# Patient Record
Sex: Female | Born: 1955 | Race: Black or African American | Hispanic: No | Smoking: Never smoker
Health system: Southern US, Community
[De-identification: ages and names within clinical notes are randomized; demographics above are authoritative.]

## PROBLEM LIST (undated history)

## (undated) DIAGNOSIS — L930 Discoid lupus erythematosus: Secondary | ICD-10-CM

## (undated) DIAGNOSIS — K769 Liver disease, unspecified: Secondary | ICD-10-CM

## (undated) DIAGNOSIS — K7689 Other specified diseases of liver: Secondary | ICD-10-CM

## (undated) DIAGNOSIS — D649 Anemia, unspecified: Secondary | ICD-10-CM

## (undated) DIAGNOSIS — I10 Essential (primary) hypertension: Secondary | ICD-10-CM

## (undated) DIAGNOSIS — E785 Hyperlipidemia, unspecified: Secondary | ICD-10-CM

## (undated) HISTORY — PX: COLONOSCOPY: SHX174

## (undated) HISTORY — DX: Essential (primary) hypertension: I10

## (undated) HISTORY — DX: Discoid lupus erythematosus: L93.0

## (undated) HISTORY — DX: Hyperlipidemia, unspecified: E78.5

## (undated) HISTORY — PX: LIVER BIOPSY: SHX301

## (undated) HISTORY — DX: Anemia, unspecified: D64.9

## (undated) HISTORY — DX: Liver disease, unspecified: K76.9

---

## 2007-08-25 ENCOUNTER — Ambulatory Visit: Payer: Self-pay | Admitting: Family Medicine

## 2007-08-25 ENCOUNTER — Observation Stay (HOSPITAL_COMMUNITY): Admission: EM | Admit: 2007-08-25 | Discharge: 2007-08-26 | Payer: Self-pay | Admitting: Emergency Medicine

## 2007-08-25 LAB — CONVERTED CEMR LAB
Cholesterol: 204 mg/dL
HDL: 49 mg/dL
LDL Cholesterol: 141 mg/dL
Triglycerides: 71 mg/dL

## 2007-08-26 LAB — CONVERTED CEMR LAB: TSH: 2.797 microintl units/mL

## 2007-09-02 ENCOUNTER — Ambulatory Visit: Payer: Self-pay | Admitting: Family Medicine

## 2007-09-02 DIAGNOSIS — R079 Chest pain, unspecified: Secondary | ICD-10-CM | POA: Insufficient documentation

## 2007-09-02 DIAGNOSIS — K219 Gastro-esophageal reflux disease without esophagitis: Secondary | ICD-10-CM | POA: Insufficient documentation

## 2007-09-07 ENCOUNTER — Encounter: Payer: Self-pay | Admitting: Family Medicine

## 2009-07-16 ENCOUNTER — Encounter: Admission: RE | Admit: 2009-07-16 | Discharge: 2009-07-16 | Payer: Self-pay | Admitting: Physician Assistant

## 2010-07-30 NOTE — Assessment & Plan Note (Signed)
Summary: np/hospital fu/per [name]/el   Vital Signs:  Patient Profile:   55 Years Old Female Height:     62.25 inches Weight:      155 pounds BMI:     28.22 Temp:     98.2 degrees F oral Pulse rate:   84 / minute BP sitting:   164 / 90  (left arm) Cuff size:   regular  Pt. in pain?   no  Vitals Entered By: Jacki Cones RN (September 02, 2007 3:01 PM)              Is Patient Diabetic? No     PCP:  Marisue Ivan  MD  Chief Complaint:  new patient and hospital follow-up chest discomfort.  History of Present Illness: Valerie Rojas is here for hospital f/u.  She was admitted to [facility] 08/25/07 for acute chest pain.  It was declared noncardiac on discharge given a nl EKG and neg cardiac enzymes.    Chest pain- Since her discharge, she has experienced 2 episodes of chest discomfort.  They have both occurred shortly after eating.  Pain in localized to the midsternum, nonexertional, described as achy pain that last for a few minutes associated with difficulty digesting and not requiring any nitroglycerin.  She is currently on Protonix 40mg  daily, ASA 81mg  daily, and nitro as needed.        Current Allergies: No known allergies   Past Medical History:    1.  Hx of GERD    2. s/p menopause    3. Hospitalization 08/25/07- chest pain (noncardiac, most likely GI)  Past Surgical History:    1. Cyst removed from left breast- 2005   Family History:    1. Mother- Heart dz, deceased ?MI? @ 46yo    2. Father- DM    3. Brother- DM  Social History:    Recently married 8/08.  Lives in [place] with husband.  No children.  Sells antiques.  Drinks 1-2 glasses of wine per week.  No tobacco or drug use.        [id number] (home)   Risk Factors:  Tobacco use:  never   Review of Systems       The patient complains of severe indigestion/heartburn.  The patient denies weight loss, weight gain, dyspnea on exhertion, peripheral edema, and abdominal pain.     Physical Exam  General:  Well-developed,well-nourished,in no acute distress; alert,appropriate and cooperative throughout examination Lungs:     Normal respiratory effort, chest expands symmetrically. Lungs are clear to auscultation, no crackles or wheezes. Heart:     Normal rate and regular rhythm. S1 and S2 normal without gallop, murmur, click, rub or other extra sounds. Abdomen:     Bowel sounds positive,abdomen soft and non-tender without masses, organomegaly or hernias noted. Extremities:     No clubbing, cyanosis, edema, or deformity noted with normal full range of motion of all joints.      Impression & Recommendations:  Problem # 1:  Hx of CHEST PAIN UNSPECIFIED (ICD-786.50) Assessment: Improved Most likely GI related but given family history, atypical chest pain and age, she has a high enough pretest probability that a stress test is recommended.  I have discussed this patient with Dr. Darrick Penna and he agrees with the plan for exercise stress test.  Will continue ASA and protonix.    Orders: [facility]- Est  Level 4 (09811)   Problem # 2:  GERD (ICD-530.81) Assessment: Unchanged Continue protonix.   Orders: [facility]- Est  Level  4 (78469)   Problem # 3:  ? of ESSENTIAL HYPERTENSION, BENIGN (ICD-401.1) Assessment: New Patient has an elevated BP reading in clinic today.  Will check again at next visit for dx since she has no prior hx of HTN.     Patient Instructions: 1)  Please schedule a follow-up appointment in 6 months. 2)  I will contact Dr. Darrick Penna today to get you set up with a exercise stress test before our next meeting. Once we have a date set up, we will contact you.   I want you to continue on your current medications. 3)  Your blood pressure was elevated today.  We will monitor it at your next visit.  I would like for you to decrease the amount of salt intake and increase the amount of exercise.  The recommendation is 30 minutes of cardiovascular exercise daily.  Hopefully, we can avoid  medication.    ]  Appended Document: ETT order

## 2010-09-10 ENCOUNTER — Other Ambulatory Visit: Payer: Self-pay | Admitting: Internal Medicine

## 2010-09-10 DIAGNOSIS — Z1231 Encounter for screening mammogram for malignant neoplasm of breast: Secondary | ICD-10-CM

## 2010-10-07 ENCOUNTER — Ambulatory Visit: Payer: Self-pay

## 2010-10-14 ENCOUNTER — Ambulatory Visit: Payer: Self-pay

## 2010-10-21 ENCOUNTER — Ambulatory Visit
Admission: RE | Admit: 2010-10-21 | Discharge: 2010-10-21 | Disposition: A | Discharge status: Home / Self Care | Payer: Private Health Insurance - Indemnity | Source: Ambulatory Visit | Attending: Internal Medicine | Admitting: Internal Medicine

## 2010-10-21 DIAGNOSIS — Z1231 Encounter for screening mammogram for malignant neoplasm of breast: Secondary | ICD-10-CM

## 2010-11-12 NOTE — Discharge Summary (Signed)
Valerie, Rojas NO.:  [id number]   MEDICAL RECORD NO.:  [id number]          PATIENT TYPE:  OBV   LOCATION:  3734                         FACILITY:  [facility]   PHYSICIAN:  Leighton Roach McDiarmid, M.D.DATE OF BIRTH:  12-Aug-1955   DATE OF ADMISSION:  08/25/2007  DATE OF DISCHARGE:  08/26/2007                               DISCHARGE SUMMARY   PRIMARY CARE PHYSICIAN:  Dr. Marisue Ivan at [facility].   DISCHARGE DIAGNOSES:   FINAL DIAGNOSES:  1. Noncardiac chest pain (gastroesophageal reflux disease versus      esophageal spasm)  2. Gastroesophageal reflux disease.   DISCHARGE MEDICATIONS:  1. Aspirin 81 mg p.o. daily.  2. Omeprazole 40 mg p.o. daily.  3. Nitroglycerin 0.4 mg sublingual every 5 minutes x3 doses p.r.n. for      chest pain.   CONSULTS:  None.   LABS ON ADMISSION:  Patient had a sodium of 142, potassium 3.7, bicarb  of 27.2, creatinine 0.9, glucose of 105, hemoglobin 14.6.  Point of care  enzymes were negative x3.  EKG showed normal sinus rhythm.  During  hospitalization, lipid panel showed a cholesterol 204, an LDL 141.  Cardiac enzymes were negative x2 greater than 8 hours post chest pain.  TSH of 2.797 on date of discharge, sodium was 140, potassium of 4,  bicarb 28, creatinine 0.82, and glucose of 95.   BRIEF HOSPITAL COURSE:  This is a 55 year old African American female  with no past medical history that was admitted for chest pain, rule out  MI.  Chest pain.  Patient came in, relief with nitroglycerin, came on  with minimal exertion while working.  She was admitted, placed on  telemetry.  She had point of care enzymes that were done initially in  the ED, were negative x3.  She was cycled cardiac enzymes greater than 8  hours post the chest pain event which were negative.  Her EKG showed  normal sinus rhythm.  Patient was noted to have a history of reflux but  was not on any medications.  During her hospitalization,  she had no  further complaints of chest pain.  She received nitroglycerin while in  the ED and she said that it improved.  We started metoprolol because she  initially had had some elevated blood pressures which came down  appropriately.  She has no history of hypertension.  Patient also noted  prior to admission that she had some left lower leg numbness.  During  her history, this resolved completely and she had no focal deficits.  She has minimal risk factors.  We did risk stratify her.  A TSH was  2.797 and she did have elevated LDL of 141.  Blood pressure was under  control.  She is not a smoker.  Plan for her is to discharge her while  she is in stable condition with close followup in a week.  I will also  schedule a stress test as an outpatient.   DISCHARGE INSTRUCTIONS:  She needs to be on a low-sodium, heart-healthy  diet.  She has no restrictions  to her activity.  She was instructed to  use nitroglycerin for chest pain and if her pain is not improved that  she should go to the ER.  Please schedule stress test for this  individual as an outpatient.  She has no insurance whatsoever.   FOLLOWUP APPOINTMENT:  She needs followup for hospital followup for  chest pain.  Followup was with Dr. Burnadette Pop at [facility], phone number [phone], on September 02, 2007, at 2:45 p.m.   DISCHARGE CONDITION:  Stable.      Marisue Ivan, MD  Electronically Signed      Leighton Roach McDiarmid, M.D.  Electronically Signed    KL/MEDQ  D:  08/26/2007  T:  08/27/2007  Job:  [id number]   cc:   Marisue Ivan, MD

## 2011-03-21 LAB — CARDIAC PANEL(CRET KIN+CKTOT+MB+TROPI)
CK, MB: 0.9
Relative Index: 0.7
Total CK: 130
Troponin I: 0.02

## 2011-03-21 LAB — LIPID PANEL
Cholesterol: 204 — ABNORMAL HIGH
HDL: 49
LDL Cholesterol: 141 — ABNORMAL HIGH
Total CHOL/HDL Ratio: 4.2
Triglycerides: 71
VLDL: 14

## 2011-03-21 LAB — POCT CARDIAC MARKERS
CKMB, poc: <1 — ABNORMAL LOW
CKMB, poc: <1 — ABNORMAL LOW
CKMB, poc: <1 — ABNORMAL LOW
Myoglobin, poc: 34.9
Myoglobin, poc: 39.4
Myoglobin, poc: 45.3
Operator id: 196461
Operator id: 234501
Operator id: 234501
Troponin i, poc: <0.05
Troponin i, poc: <0.05
Troponin i, poc: <0.05

## 2011-03-21 LAB — I-STAT 8, (EC8 V) (CONVERTED LAB)
Acid-Base Excess: 1
BUN: 7
Bicarbonate: 27.2 — ABNORMAL HIGH
Chloride: 106
Glucose, Bld: 105 — ABNORMAL HIGH
HCT: 43
Hemoglobin: 14.6
Operator id: 234501
Potassium: 3.7
Sodium: 142
TCO2: 29
pCO2, Ven: 47.7
pH, Ven: 7.363 — ABNORMAL HIGH

## 2011-03-21 LAB — CK TOTAL AND CKMB (NOT AT ARMC)
CK, MB: 1.1
Relative Index: 0.7
Total CK: 158

## 2011-03-21 LAB — BASIC METABOLIC PANEL
BUN: 8
CO2: 28
Calcium: 9.7
Chloride: 104
Creatinine, Ser: 0.82
GFR calc Af Amer: >60
GFR calc non Af Amer: >60
Glucose, Bld: 95
Potassium: 4
Sodium: 140

## 2011-03-21 LAB — TROPONIN I: Troponin I: 0.02

## 2011-03-21 LAB — CBC
HCT: 34.7 — ABNORMAL LOW
Hemoglobin: 11.1 — ABNORMAL LOW
MCHC: 32.2
MCV: 71.3 — ABNORMAL LOW
Platelets: 211
RBC: 4.87
RDW: 15.2
WBC: 5.1

## 2011-03-21 LAB — TSH: TSH: 2.797

## 2011-03-21 LAB — POCT I-STAT CREATININE
Creatinine, Ser: 0.9
Operator id: 234501

## 2011-09-24 ENCOUNTER — Ambulatory Visit
Admission: RE | Admit: 2011-09-24 | Discharge: 2011-09-24 | Disposition: A | Discharge status: Home / Self Care | Payer: Private Health Insurance - Indemnity | Source: Ambulatory Visit | Attending: Nurse Practitioner | Admitting: Nurse Practitioner

## 2011-09-24 ENCOUNTER — Other Ambulatory Visit: Payer: Self-pay | Admitting: Nurse Practitioner

## 2011-09-24 DIAGNOSIS — R05 Cough: Secondary | ICD-10-CM

## 2011-09-24 DIAGNOSIS — R059 Cough, unspecified: Secondary | ICD-10-CM

## 2012-11-03 ENCOUNTER — Ambulatory Visit: Payer: Private Health Insurance - Indemnity | Admitting: Dietician

## 2012-11-26 ENCOUNTER — Other Ambulatory Visit: Payer: Self-pay | Admitting: Gastroenterology

## 2012-11-26 DIAGNOSIS — R7989 Other specified abnormal findings of blood chemistry: Secondary | ICD-10-CM

## 2012-11-30 ENCOUNTER — Encounter: Payer: Self-pay | Admitting: *Deleted

## 2012-11-30 ENCOUNTER — Encounter: Payer: Managed Care, Other (non HMO) | Attending: Nurse Practitioner | Admitting: *Deleted

## 2012-11-30 DIAGNOSIS — I10 Essential (primary) hypertension: Secondary | ICD-10-CM | POA: Insufficient documentation

## 2012-11-30 DIAGNOSIS — Z713 Dietary counseling and surveillance: Secondary | ICD-10-CM | POA: Insufficient documentation

## 2012-11-30 DIAGNOSIS — R7309 Other abnormal glucose: Secondary | ICD-10-CM | POA: Insufficient documentation

## 2012-11-30 DIAGNOSIS — E785 Hyperlipidemia, unspecified: Secondary | ICD-10-CM | POA: Insufficient documentation

## 2012-11-30 NOTE — Progress Notes (Signed)
  Medical Nutrition Therapy:  Appt start time: 1500 end time:  1600.  Assessment:  Primary concerns today: patient here for nutrition assessment with history of HTN, some hyperlipidemia now controlled, and pre-diabetes. She enjoys cooking and prepares meals for her family including her husband and her nephew who lives with them. She enjoys vegetables, has an organic herb garden, eats whole grain breads and pastas. Less active in [place] than she was in [place] where she moved from in 2008. Self employed, owns a [employer]. Enjoys being outside especially gardening. Eat out rarely. SMBG and checks a few times a week, with stated range of 90 - 105 including pre and post meals.  MEDICATIONS: see list   DIETARY INTAKE:  Usual eating pattern includes 3 meals and 1 snacks per day.  Everyday foods include good variety of all food groups.  Avoided foods include fatty foods.    24-hr recall:  B ( AM): home fries, over easy egg, croissant, OR Vitamix green drink with spinach, kale, ginger, garlic, pineapple, flaxseed and water, to drinkcoffee  Snk ( AM): not usually  L ( PM): salad with salmon OR left overs OR lean meat and cheese sandwich, with extra green lettuce, water Snk ( PM): no D ( PM): lean meat, usually chicken, vegetables and occasionally a starch, water and or red wine Snk ( PM): popcorn Beverages: coffee, herbal tea, water, red wine  Usual physical activity: active lifestyle, but not as much exercise as when lived in [place].  Estimated energy needs: 1400 calories 158/ g carbohydrates 105 g protein 39 g fat  Progress Towards Goal(s):  In progress.   Nutritional Diagnosis:  NB-1.1 Food and nutrition-related knowledge deficit As related to pre-diabetes.  As evidenced by A1c of 6.4.    Intervention:  Nutrition counseling and pre-diabetes education initiated. Discussed role of carbohydrate to BG, SMBG and rationale of checking BG at alternate times of day, A1c,  Carb Counting and reading food labels, and benefits of increased activity. She stated interest in working towards a more consistent carb intake as well as decreasing fat content of some of her food choices. Also very interested in starting water aerobics as her preferred activity.  Plan:  Aim for 3 Carb Choices per meal (45 grams) +/- 1 either way  Aim for 0-1 Carbs per snack if hungry  Consider reading food labels for Total Carbohydrate and Fat Grams of foods Consider  increasing your activity level by water aerobics class at [facility] as tolerated   Handouts given during visit include: Carb Counting and Food Label handouts Meal Plan Card  Monitoring/Evaluation:  Dietary intake, exercise, reading food labels, and body weight prn.

## 2012-11-30 NOTE — Patient Instructions (Addendum)
Plan:  Aim for 3 Carb Choices per meal (45 grams) +/- 1 either way  Aim for 0-1 Carbs per snack if hungry  Consider reading food labels for Total Carbohydrate and Fat Grams of foods Consider  increasing your activity level by water aerobics class at [facility] as tolerated

## 2012-12-03 ENCOUNTER — Ambulatory Visit
Admission: RE | Admit: 2012-12-03 | Discharge: 2012-12-03 | Disposition: A | Discharge status: Home / Self Care | Payer: 59 | Source: Ambulatory Visit | Attending: Gastroenterology | Admitting: Gastroenterology

## 2012-12-03 DIAGNOSIS — R7989 Other specified abnormal findings of blood chemistry: Secondary | ICD-10-CM

## 2012-12-06 ENCOUNTER — Other Ambulatory Visit: Payer: Private Health Insurance - Indemnity

## 2012-12-06 ENCOUNTER — Other Ambulatory Visit (HOSPITAL_COMMUNITY): Payer: Self-pay | Admitting: Gastroenterology

## 2012-12-06 DIAGNOSIS — R945 Abnormal results of liver function studies: Secondary | ICD-10-CM

## 2012-12-06 DIAGNOSIS — R7989 Other specified abnormal findings of blood chemistry: Secondary | ICD-10-CM

## 2012-12-10 ENCOUNTER — Other Ambulatory Visit: Payer: Self-pay | Admitting: Radiology

## 2012-12-14 ENCOUNTER — Encounter (HOSPITAL_COMMUNITY): Payer: Self-pay

## 2012-12-14 ENCOUNTER — Ambulatory Visit (HOSPITAL_COMMUNITY)
Admission: RE | Admit: 2012-12-14 | Discharge: 2012-12-14 | Disposition: A | Discharge status: Home / Self Care | Payer: Managed Care, Other (non HMO) | Source: Ambulatory Visit | Attending: Gastroenterology | Admitting: Gastroenterology

## 2012-12-14 DIAGNOSIS — R7989 Other specified abnormal findings of blood chemistry: Secondary | ICD-10-CM

## 2012-12-14 DIAGNOSIS — R748 Abnormal levels of other serum enzymes: Secondary | ICD-10-CM | POA: Insufficient documentation

## 2012-12-14 DIAGNOSIS — Z01812 Encounter for preprocedural laboratory examination: Secondary | ICD-10-CM | POA: Insufficient documentation

## 2012-12-14 DIAGNOSIS — R945 Abnormal results of liver function studies: Secondary | ICD-10-CM

## 2012-12-14 DIAGNOSIS — I10 Essential (primary) hypertension: Secondary | ICD-10-CM | POA: Insufficient documentation

## 2012-12-14 LAB — APTT: aPTT: 30 seconds (ref 24–37)

## 2012-12-14 LAB — CBC
HCT: 35.3 % — ABNORMAL LOW (ref 36.0–46.0)
Hemoglobin: 11.5 g/dL — ABNORMAL LOW (ref 12.0–15.0)
MCH: 22.7 pg — ABNORMAL LOW (ref 26.0–34.0)
MCHC: 32.6 g/dL (ref 30.0–36.0)
MCV: 69.8 fL — ABNORMAL LOW (ref 78.0–100.0)
Platelets: 165 10*3/uL (ref 150–400)
RBC: 5.06 MIL/uL (ref 3.87–5.11)
RDW: 15 % (ref 11.5–15.5)
WBC: 4.9 10*3/uL (ref 4.0–10.5)

## 2012-12-14 LAB — PROTIME-INR
INR: 0.89 (ref 0.00–1.49)
Prothrombin Time: 12 seconds (ref 11.6–15.2)

## 2012-12-14 MED ORDER — MIDAZOLAM HCL 2 MG/2ML IJ SOLN
INTRAMUSCULAR | Status: AC
  Filled 2012-12-14: qty 4

## 2012-12-14 MED ORDER — FENTANYL CITRATE 0.05 MG/ML IJ SOLN
INTRAMUSCULAR | Status: AC | PRN
  Administered 2012-12-14 (×3): 25 ug via INTRAVENOUS

## 2012-12-14 MED ORDER — MIDAZOLAM HCL 2 MG/2ML IJ SOLN
INTRAMUSCULAR | Status: AC | PRN
  Administered 2012-12-14 (×2): 0.5 mg via INTRAVENOUS
  Administered 2012-12-14: 2 mg via INTRAVENOUS

## 2012-12-14 MED ORDER — SODIUM CHLORIDE 0.9 % IV SOLN: Freq: Once | INTRAVENOUS | Status: DC

## 2012-12-14 MED ORDER — FENTANYL CITRATE 0.05 MG/ML IJ SOLN
INTRAMUSCULAR | Status: AC
  Filled 2012-12-14: qty 4

## 2012-12-14 NOTE — H&P (Signed)
Agree 

## 2012-12-14 NOTE — Progress Notes (Signed)
Pt's BP has been high 160/100 after having pt rest/ void.  Taken in different arms with different cuffs.  Gardner Candle, PA notified.  Pt instructed to take her HCTZ when she gets home and to monitor her BP at home.  If it remains high, pt is to contact her family Dr. For further instruction.

## 2012-12-14 NOTE — Procedures (Signed)
Procedure:  Ultrasound guided liver core biopsy 18 G core biopsy x 2 via 17 G needle of right lobe of liver.  Tract plugged with Gelfoam pledgets.

## 2012-12-14 NOTE — H&P (Signed)
Valerie Rojas is an 57 y.o. female.   Chief Complaint: Elevated liver functions off and on for yrs. She has moved here from [place] and is followed by Dr Loreta Ave Request made for liver core biopsy HPI: HTN  Past Medical History  Diagnosis Date  . Hypertension     History reviewed. No pertinent past surgical history.  No family history on file. Social History:  has no tobacco, alcohol, and drug history on file.  Allergies: No Known Allergies   (Not in a hospital admission)  No results found for this or any previous visit (from the past 48 hour(s)). No results found.  Review of Systems  Constitutional: Negative for fever.  Respiratory: Negative for shortness of breath.   Cardiovascular: Negative for chest pain.  Gastrointestinal: Negative for nausea, vomiting and abdominal pain.  Neurological: Negative for weakness and headaches.    Blood pressure 143/87, pulse 82, temperature 97.1 F (36.2 C), temperature source Oral, resp. rate 18, height 5\' 2"  (1.575 m), weight 150 lb (68.04 kg), SpO2 98.00%. Physical Exam  Constitutional: She is oriented to person, place, and time. She appears well-developed and well-nourished.  Cardiovascular: Normal rate, regular rhythm and normal heart sounds.   No murmur heard. Respiratory: Effort normal and breath sounds normal. She has no wheezes.  GI: Soft. Bowel sounds are normal. There is no tenderness.  Musculoskeletal: Normal range of motion.  Neurological: She is alert and oriented to person, place, and time.  Skin: Skin is warm and dry.  Psychiatric: She has a normal mood and affect. Her behavior is normal. Judgment and thought content normal.     Assessment/Plan Elevated LFTs Scheduled for liver core biopsy Pt aware of procedure benefits and risks and agreeable to proceed Consent signed and in chart  Minaal Struckman A 12/14/2012, 9:47 AM

## 2013-04-03 ENCOUNTER — Encounter (HOSPITAL_COMMUNITY): Payer: Self-pay

## 2013-04-03 ENCOUNTER — Observation Stay (HOSPITAL_COMMUNITY)
Admission: EM | Admit: 2013-04-03 | Discharge: 2013-04-03 | Disposition: A | Discharge status: Home / Self Care | Payer: Managed Care, Other (non HMO) | Attending: Emergency Medicine | Admitting: Emergency Medicine

## 2013-04-03 ENCOUNTER — Emergency Department (HOSPITAL_COMMUNITY): Payer: Managed Care, Other (non HMO)

## 2013-04-03 DIAGNOSIS — D649 Anemia, unspecified: Secondary | ICD-10-CM | POA: Diagnosis present

## 2013-04-03 DIAGNOSIS — K219 Gastro-esophageal reflux disease without esophagitis: Secondary | ICD-10-CM | POA: Diagnosis present

## 2013-04-03 DIAGNOSIS — R079 Chest pain, unspecified: Principal | ICD-10-CM | POA: Diagnosis present

## 2013-04-03 DIAGNOSIS — I10 Essential (primary) hypertension: Secondary | ICD-10-CM

## 2013-04-03 DIAGNOSIS — R748 Abnormal levels of other serum enzymes: Secondary | ICD-10-CM | POA: Diagnosis present

## 2013-04-03 DIAGNOSIS — R110 Nausea: Secondary | ICD-10-CM | POA: Insufficient documentation

## 2013-04-03 DIAGNOSIS — R7401 Elevation of levels of liver transaminase levels: Secondary | ICD-10-CM

## 2013-04-03 DIAGNOSIS — R5381 Other malaise: Secondary | ICD-10-CM | POA: Insufficient documentation

## 2013-04-03 HISTORY — DX: Other specified diseases of liver: K76.89

## 2013-04-03 LAB — CBC
HCT: 33.4 % — ABNORMAL LOW (ref 36.0–46.0)
Hemoglobin: 10.9 g/dL — ABNORMAL LOW (ref 12.0–15.0)
MCH: 23.1 pg — ABNORMAL LOW (ref 26.0–34.0)
MCHC: 32.6 g/dL (ref 30.0–36.0)
MCV: 70.9 fL — ABNORMAL LOW (ref 78.0–100.0)
Platelets: 188 10*3/uL (ref 150–400)
RBC: 4.71 MIL/uL (ref 3.87–5.11)
RDW: 15.4 % (ref 11.5–15.5)
WBC: 5.3 10*3/uL (ref 4.0–10.5)

## 2013-04-03 LAB — URINALYSIS, ROUTINE W REFLEX MICROSCOPIC
Bilirubin Urine: NEGATIVE
Glucose, UA: NEGATIVE mg/dL
Hgb urine dipstick: NEGATIVE
Ketones, ur: NEGATIVE mg/dL
Leukocytes, UA: NEGATIVE
Nitrite: NEGATIVE
Protein, ur: NEGATIVE mg/dL
Specific Gravity, Urine: 1.012 (ref 1.005–1.030)
Urobilinogen, UA: 0.2 mg/dL (ref 0.0–1.0)
pH: 6.5 (ref 5.0–8.0)

## 2013-04-03 LAB — COMPREHENSIVE METABOLIC PANEL
ALT: 76 U/L — ABNORMAL HIGH (ref 0–35)
AST: 42 U/L — ABNORMAL HIGH (ref 0–37)
Albumin: 3.8 g/dL (ref 3.5–5.2)
Alkaline Phosphatase: 242 U/L — ABNORMAL HIGH (ref 39–117)
BUN: 11 mg/dL (ref 6–23)
CO2: 25 mEq/L (ref 19–32)
Calcium: 9.5 mg/dL (ref 8.4–10.5)
Chloride: 103 mEq/L (ref 96–112)
Creatinine, Ser: 0.78 mg/dL (ref 0.50–1.10)
GFR calc Af Amer: >90 mL/min (ref >90)
GFR calc non Af Amer: >90 mL/min (ref >90)
Glucose, Bld: 88 mg/dL (ref 70–99)
Potassium: 3.8 mEq/L (ref 3.5–5.1)
Sodium: 142 mEq/L (ref 135–145)
Total Bilirubin: 0.3 mg/dL (ref 0.3–1.2)
Total Protein: 7.6 g/dL (ref 6.0–8.3)

## 2013-04-03 LAB — POCT I-STAT TROPONIN I
Troponin i, poc: 0 ng/mL (ref 0.00–0.08)
Troponin i, poc: 0 ng/mL (ref 0.00–0.08)

## 2013-04-03 LAB — PRO B NATRIURETIC PEPTIDE: Pro B Natriuretic peptide (BNP): 136.4 pg/mL — ABNORMAL HIGH (ref 0–125)

## 2013-04-03 LAB — PROTIME-INR
INR: 0.94 (ref 0.00–1.49)
Prothrombin Time: 12.4 seconds (ref 11.6–15.2)

## 2013-04-03 LAB — APTT: aPTT: 28 seconds (ref 24–37)

## 2013-04-03 MED ORDER — HYDROCHLOROTHIAZIDE 25 MG PO TABS: 25.0000 mg | ORAL_TABLET | Freq: Every day | ORAL | Status: DC

## 2013-04-03 NOTE — ED Provider Notes (Addendum)
CSN: [id number]     Arrival date & time 04/03/13  1432 History   First MD Initiated Contact with Patient 04/03/13 1458     Chief Complaint  Patient presents with  . Hypertension  . Chest Pain   (Consider location/radiation/quality/duration/timing/severity/associated sxs/prior Treatment) HPI Comments: [name] presents emergency department with chief complaint of weakness, nausea, left chest pain. Patient was at church today singing in the choir when the symptoms began. She excuse herself from the choir and walked to the church cafeteria at which time her legs became wobbly and she became progressively more weak. A church member who is also a nurse took her blood pressure it was found to be elevated with systolic over 200. She was taken to a nearby fire department where an ambulance transported her to the emergency department.  Patient denied any symptoms during my interview. She states all her symptoms resolved and her current blood pressure was systolic blood pressure in the 130s. She denies history of blood clots in her legs lungs or anywhere else, travel history, recent surgery. She has been recently evaluated for elevated LFTs for which she's had a liver biopsy and consult with gastroenterology. A medication DRISDOL was prescribed for her however she took it twice and stated it made her feel uncomfortable so she stopped taking it. Of note she's also been out of her blood pressure medication HCTZ for the past 6 days.    She denies recent fever, chills, infections, antibiotics, previous cardiac history, she is a nonsmoker, doesn't drink or do drugs. Patient denies symptoms of paralysis, paresthesias, slurred speech, ataxia, facial droop, or other neurologic deficits.  Patient is a 57 y.o. female presenting with hypertension and chest pain. The history is provided by the patient and the spouse.  Hypertension This is a new problem. The current episode started less than 1 hour ago. The  problem occurs constantly. The problem has been resolved. Associated symptoms include chest pain. Pertinent negatives include no abdominal pain, no headaches and no shortness of breath. Nothing aggravates the symptoms. She has tried nothing for the symptoms.  Chest Pain Associated symptoms: fatigue, nausea and weakness   Associated symptoms: no abdominal pain, no headache, no palpitations and no shortness of breath     Past Medical History  Diagnosis Date  . Hypertension   . Other specified disorders of liver     In process of dx issues with liver/bile duct   Past Surgical History  Procedure Laterality Date  . Liver biopsy     History reviewed. No pertinent family history. History  Substance Use Topics  . Smoking status: Never Smoker   . Smokeless tobacco: Never Used  . Alcohol Use: No   OB History   Grav Para Term Preterm Abortions TAB SAB Ect Mult Living                 Review of Systems  Constitutional: Positive for fatigue.  HENT: Negative.   Eyes: Negative.   Respiratory: Negative for shortness of breath.   Cardiovascular: Positive for chest pain. Negative for palpitations and leg swelling.  Gastrointestinal: Positive for nausea. Negative for abdominal pain.  Endocrine: Negative.   Genitourinary: Negative.   Musculoskeletal: Negative.   Skin: Negative.   Neurological: Positive for weakness. Negative for headaches.    Allergies  Review of patient's allergies indicates no known allergies.  Home Medications   Current Outpatient Rx  Name  Route  Sig  Dispense  Refill  . hydrochlorothiazide (HYDRODIURIL) 25  MG tablet   Oral   Take 25 mg by mouth daily.         Marland Kitchen OVER THE COUNTER MEDICATION   Both Eyes   Place 3 drops into both eyes daily as needed (dry eye).         . Vitamin D, Ergocalciferol, (DRISDOL) 50000 UNITS CAPS   Oral   Take 50,000 Units by mouth every 7 (seven) days. Takes on (or around) each Friday.          BP 171/88  Pulse 72   Temp(Src) 97.8 F (36.6 C) (Oral)  Resp 25  SpO2 100% Physical Exam  Nursing note and vitals reviewed. Constitutional: She is oriented to person, place, and time. She appears well-developed and well-nourished.  HENT:  Head: Normocephalic and atraumatic.  Right Ear: External ear normal.  Left Ear: External ear normal.  Nose: Nose normal.  Mouth/Throat: Oropharynx is clear and moist. No oropharyngeal exudate.  Eyes: Conjunctivae and EOM are normal. Pupils are equal, round, and reactive to light. Right eye exhibits no discharge. Left eye exhibits no discharge.  Neck: Normal range of motion. Neck supple. No JVD present.  Cardiovascular: Normal rate and regular rhythm.  Exam reveals no friction rub.   No murmur heard. Pulmonary/Chest: Effort normal and breath sounds normal. No stridor.  Abdominal: Soft. Bowel sounds are normal. She exhibits no distension and no mass. There is no tenderness. There is no rebound and no guarding.  Musculoskeletal: Normal range of motion. She exhibits no edema and no tenderness.  Lymphadenopathy:    She has no cervical adenopathy.  Neurological: She is alert and oriented to person, place, and time. She has normal strength and normal reflexes. No cranial nerve deficit or sensory deficit. GCS eye subscore is 4. GCS verbal subscore is 5. GCS motor subscore is 6.  Normal F to N, Normal RAM,  Normal MS,  Normal sensation.  Normal stance and gait.  Negative facial droop, slurred speech, pronator drift.    Skin: Skin is warm and dry.    Results for orders placed during the hospital encounter of 04/03/13  PRO B NATRIURETIC PEPTIDE      Result Value Range   Pro B Natriuretic peptide (BNP) 136.4 (*) 0 - 125 pg/mL  CBC      Result Value Range   WBC 5.3  4.0 - 10.5 K/uL   RBC 4.71  3.87 - 5.11 MIL/uL   Hemoglobin 10.9 (*) 12.0 - 15.0 g/dL   HCT 16.1 (*) 09.6 - 04.5 %   MCV 70.9 (*) 78.0 - 100.0 fL   MCH 23.1 (*) 26.0 - 34.0 pg   MCHC 32.6  30.0 - 36.0 g/dL   RDW  40.9  81.1 - 91.4 %   Platelets 188  150 - 400 K/uL  COMPREHENSIVE METABOLIC PANEL      Result Value Range   Sodium 142  135 - 145 mEq/L   Potassium 3.8  3.5 - 5.1 mEq/L   Chloride 103  96 - 112 mEq/L   CO2 25  19 - 32 mEq/L   Glucose, Bld 88  70 - 99 mg/dL   BUN 11  6 - 23 mg/dL   Creatinine, Ser 7.82  0.50 - 1.10 mg/dL   Calcium 9.5  8.4 - 95.6 mg/dL   Total Protein 7.6  6.0 - 8.3 g/dL   Albumin 3.8  3.5 - 5.2 g/dL   AST 42 (*) 0 - 37 U/L   ALT 76 (*) 0 -  35 U/L   Alkaline Phosphatase 242 (*) 39 - 117 U/L   Total Bilirubin 0.3  0.3 - 1.2 mg/dL   GFR calc non Af Amer >90  >90 mL/min   GFR calc Af Amer >90  >90 mL/min  PROTIME-INR      Result Value Range   Prothrombin Time 12.4  11.6 - 15.2 seconds   INR 0.94  0.00 - 1.49  APTT      Result Value Range   aPTT 28  24 - 37 seconds  URINALYSIS, ROUTINE W REFLEX MICROSCOPIC      Result Value Range   Color, Urine YELLOW  YELLOW   APPearance CLEAR  CLEAR   Specific Gravity, Urine 1.012  1.005 - 1.030   pH 6.5  5.0 - 8.0   Glucose, UA NEGATIVE  NEGATIVE mg/dL   Hgb urine dipstick NEGATIVE  NEGATIVE   Bilirubin Urine NEGATIVE  NEGATIVE   Ketones, ur NEGATIVE  NEGATIVE mg/dL   Protein, ur NEGATIVE  NEGATIVE mg/dL   Urobilinogen, UA 0.2  0.0 - 1.0 mg/dL   Nitrite NEGATIVE  NEGATIVE   Leukocytes, UA NEGATIVE  NEGATIVE  POCT I-STAT TROPONIN I      Result Value Range   Troponin i, poc 0.00  0.00 - 0.08 ng/mL   Comment 3            Repeat Troponin:  0.00  ED Course  Procedures (including critical care time) Labs Review  Imaging Review Dg Chest 2 View  04/03/2013   CLINICAL DATA:  Chest pain, dizziness and hypertension.  EXAM: CHEST - 2 VIEW  COMPARISON:  09/24/2011  FINDINGS: The heart size and mediastinal contours are within normal limits. There is no evidence of pulmonary edema, consolidation, pneumothorax, nodule or pleural fluid. Stable mild degenerative changes are present throughout the thoracic spine.  IMPRESSION: No  active disease.   Electronically Signed   By: Irish Lack M.D.   On: 04/03/2013 16:08     Date: 04/03/2013  Rate: 84  Rhythm: normal sinus rhythm  QRS Axis: normal  Intervals: normal  ST/T Wave abnormalities: normal  Conduction Disutrbances:none  Narrative Interpretation:   Old EKG Reviewed: none available   Date: 04/03/2013 @ 1908  Rate: 67  Rhythm: normal sinus rhythm  QRS Axis: normal  Intervals: PR shortened  ST/T Wave abnormalities: normal  Conduction Disutrbances:none  Narrative Interpretation:   Old EKG Reviewed: unchanged     MDM  No diagnosis found. 57 year old African American female presents emergency department with nausea, weakness, chest pain. Patient's history is concerning for possible ACS therefore an ACS workup will be completed. She does not have a neurologic deficit nor headache so there is no indication for a CT head at this time. Her blood pressure improved without treatment and her systolic blood pressure was in the 130s to 140s during my history and physical. Currently patient was without any symptoms and in no acute distress.  Labs negative.  ER w/u negative.  Will consult medicine.    Discuss case with Dr. Penny Pia who agrees to admit patient to observation status.  7:32 PM Lengthy discussion with patient, her husband, and her son. Patient adamantly refuses to be admitted to the hospital she is pleasant, logical, clear thinking, and understands the risks versus benefits of her decision. Her husband agrees with her decision. She has not been given narcotic medications. She has a followup appointment with her primary care provider on Tuesday, 2 days from now, and she would  rather follow up as an outpatient. I let the patient provider Dr. Cena Benton know the patient's decision and we will cancel her admission orders as well as her cardiology consult which he placed. The patient did agree to a second EKG and troponin prior to discharge.  I will restart  hydrochlorothiazide as patient has ran out of this medication.  8:04 PM Second EKG and second troponins negative for ischemic changes. Results were discussed with the patient and her husband and she is aware of the need to followup and also her need for outpatient risk stratification. Discharge patient to home in stable condition.  She is aware she may return to the ER for any concern.  Doubt AMI, Stroke, TIA, or life threat.    Valerie Gales, MD 04/03/13 Valerie Rojas  Valerie Gales, MD 04/13/13 1310  Valerie Gales, MD 04/13/13 [phone]

## 2013-04-03 NOTE — ED Notes (Signed)
Admitting at bedside to assess. Pt resting quietly at the time. Vital signs stable.

## 2013-04-03 NOTE — ED Notes (Signed)
Pt returned from xray

## 2013-04-03 NOTE — ED Notes (Addendum)
Pt stopped at the base c/o chest pain, tingling down her left arm and neck, and dizziness.  Initial pressures were rt arm 220/110 left 200/110.  Pain subsided by EMS arrival with only c/o weakness.  Pt has been out of HTN medications 5-6 days. A&O, in NAD.

## 2013-04-03 NOTE — H&P (Signed)
[facility] History and Physical  Valerie Rojas BJY:[id number] DOB: 1956-06-15 DOA: 04/03/2013   Referring physician: Dr. Redgie Grayer PCP: Alva Garnet., MD  Specialists: GI & liver specialist  Chief Complaint: Chest pain  HPI: Valerie Rojas is a 57 y.o. female  With past medical history of HTN, elevated liver enzymes, GERD, prior chest pain who presented to the ED with chest pain after the singing in church choir. Pt states she was singing and suddenly felt "bad". She states she got thirsty, legs felts like giving out, was nauseated  and started sweating. She had chest pain described as a pressures sensation on left chest that radiated to back and shoulder and up to jaw. Pt states that a nurse in church took her blood pressure at time of episode and BP was in the 200s. Pt states that nothing made the pain worse but sitting and resting improved the pain.  Pt states this chest pain is similar to previous episode two years ago where she was admitted to hospital and was just put on HTN medication. Pt has HTN medication prescribed but pt currently does not take daily and checks bp on a daily basis.Pt does states she has increased stress working at home.   Pt denies currently chest pain, SOB, nausea or  Vomiting.  In ED chest x ray was negative, troponin was not elevated, and EKG did not show any ST elevations or depressions.  Review of Systems: The patient denies  chest pain, syncope, dyspnea on exertion, peripheral edema,  abdominal pain, blood in stool, nausea, vomiting,severe indigestion/heartburn, hematuria, incontinence, frequency, dysuria, or difficulty walking.  Past Medical History  Diagnosis Date  . Hypertension   . Other specified disorders of liver     In process of dx issues with liver/bile duct   Past Surgical History  Procedure Laterality Date  . Liver biopsy     Social History:  reports that she has never smoked. She has never used smokeless tobacco. She reports that  she does not drink alcohol or use illicit drugs. Pt lives at home with husband  Can patient participate in ADLs? Yes   No Known Allergies  History reviewed. No pertinent family history.   Prior to Admission medications   Medication Sig Start Date End Date Taking? Authorizing Provider  hydrochlorothiazide (HYDRODIURIL) 25 MG tablet Take 25 mg by mouth daily.   Yes Historical Provider, MD  OVER THE COUNTER MEDICATION Place 3 drops into both eyes daily as needed (dry eye).   Yes Historical Provider, MD  Vitamin D, Ergocalciferol, (DRISDOL) 50000 UNITS CAPS Take 50,000 Units by mouth every 7 (seven) days. Takes on (or around) each Friday.   Yes Historical Provider, MD   Physical Exam: Filed Vitals:   04/03/13 1515  BP: 171/88  Pulse: 72  Temp:   Resp: 25     General: pt laying in bed in NAD  Eyes: pupils 2mm PEERLA. Non icteric   ENT: MM moist, tongue pink and midline  Neck: supple, no goiter noted  Cardiovascular: RRR wit no MRG. Radial and distal pulses 2+ with no cyanosis or edema.  Respiratory: regular unlabored breathing. Lungs CTA no wheeze  Abdomen: symmetric, soft NT ND BSx4  Skin: clean dry and intact  Musculoskeletal: moves all extremities equally. No cyanois  Psychiatric: mood and affect normal  Neurologic: alert and oriented. Upper and lower extremity 5/5 strength   Labs on Admission:  Basic Metabolic Panel:  Recent Labs Lab 04/03/13 1635  NA 142  K 3.8  CL 103  CO2 25  GLUCOSE 88  BUN 11  CREATININE 0.78  CALCIUM 9.5   Liver Function Tests:  Recent Labs Lab 04/03/13 1635  AST 42*  ALT 76*  ALKPHOS 242*  BILITOT 0.3  PROT 7.6  ALBUMIN 3.8   No results found for this basename: LIPASE, AMYLASE,  in the last 168 hours No results found for this basename: AMMONIA,  in the last 168 hours CBC:  Recent Labs Lab 04/03/13 1635  WBC 5.3  HGB 10.9*  HCT 33.4*  MCV 70.9*  PLT 188   Cardiac Enzymes: No results found for this basename:  CKTOTAL, CKMB, CKMBINDEX, TROPONINI,  in the last 168 hours  BNP (last 3 results)  Recent Labs  04/03/13 1635  PROBNP 136.4*   CBG: No results found for this basename: GLUCAP,  in the last 168 hours  Radiological Exams on Admission: Dg Chest 2 View  04/03/2013   CLINICAL DATA:  Chest pain, dizziness and hypertension.  EXAM: CHEST - 2 VIEW  COMPARISON:  09/24/2011  FINDINGS: The heart size and mediastinal contours are within normal limits. There is no evidence of pulmonary edema, consolidation, pneumothorax, nodule or pleural fluid. Stable mild degenerative changes are present throughout the thoracic spine.  IMPRESSION: No active disease.   Electronically Signed   By: Irish Lack M.D.   On: 04/03/2013 16:08    EKG: Independently reviewed. NSR with no ST elevation or depression  Assessment/Plan Active Problems: Chest discomfort HTN (urgency) GERD Anemia Elevated liver enzymes  1. Chest Pain  - EKG in AM - Morphine for pain PRN - Nitroglycerin for chest pain PRN -Cardiology consulted [facility] - Troponin  q6 hours x 3 - ASA 325 mg daily  - Phos and TSH level to be checked  2. HTN - HCTZ - PRN labetalol for sbp >180 or dbp>110 - continue to monitor  3. GERD - Stable - Protonix ordered  4. DVT prophylaxis -Heparin  5. Anemia -will monitor cbc in AM - Anemia panel   6. Elevated liver enzymes - pt to follow up with GI specialist once discharged   if consultant consulted, please document name and whether formally or informally consulted  Code Status: full  Family Communication: POC reviewed with pt and family   Disposition Plan: pending evaluation and recommendation from cardiology  Time spent: >60 minutes  Penny Pia [facility] Pager [phone]  If 7PM-7AM, please contact night-coverage [email or web address] Password [facility] 04/03/2013, 6:32 PM

## 2014-03-24 ENCOUNTER — Other Ambulatory Visit (HOSPITAL_COMMUNITY): Payer: Self-pay | Admitting: Rheumatology

## 2014-03-24 DIAGNOSIS — R748 Abnormal levels of other serum enzymes: Secondary | ICD-10-CM

## 2014-03-29 ENCOUNTER — Encounter (HOSPITAL_COMMUNITY)
Admission: RE | Admit: 2014-03-29 | Discharge: 2014-03-29 | Disposition: A | Discharge status: Home / Self Care | Payer: BC Managed Care – PPO | Source: Ambulatory Visit | Attending: Rheumatology | Admitting: Rheumatology

## 2014-03-29 DIAGNOSIS — R748 Abnormal levels of other serum enzymes: Secondary | ICD-10-CM | POA: Diagnosis present

## 2014-03-29 MED ORDER — TECHNETIUM TC 99M MEDRONATE IV KIT
25.0000 | PACK | Freq: Once | INTRAVENOUS | Status: AC | PRN
  Administered 2014-03-29: 25 via INTRAVENOUS

## 2015-08-08 ENCOUNTER — Other Ambulatory Visit: Payer: Self-pay | Admitting: Internal Medicine

## 2015-08-08 DIAGNOSIS — Z1231 Encounter for screening mammogram for malignant neoplasm of breast: Secondary | ICD-10-CM

## 2015-09-21 ENCOUNTER — Ambulatory Visit: Payer: Managed Care, Other (non HMO)

## 2015-10-05 ENCOUNTER — Ambulatory Visit
Admission: RE | Admit: 2015-10-05 | Discharge: 2015-10-05 | Disposition: A | Discharge status: Home / Self Care | Payer: PRIVATE HEALTH INSURANCE | Source: Ambulatory Visit | Attending: Internal Medicine | Admitting: Internal Medicine

## 2015-10-05 DIAGNOSIS — Z1231 Encounter for screening mammogram for malignant neoplasm of breast: Secondary | ICD-10-CM

## 2018-04-23 ENCOUNTER — Encounter: Payer: Self-pay | Admitting: Gastroenterology

## 2018-05-19 ENCOUNTER — Other Ambulatory Visit: Payer: No Typology Code available for payment source

## 2018-05-19 ENCOUNTER — Other Ambulatory Visit (INDEPENDENT_AMBULATORY_CARE_PROVIDER_SITE_OTHER): Payer: No Typology Code available for payment source

## 2018-05-19 ENCOUNTER — Encounter: Payer: Self-pay | Admitting: Gastroenterology

## 2018-05-19 ENCOUNTER — Ambulatory Visit (INDEPENDENT_AMBULATORY_CARE_PROVIDER_SITE_OTHER): Payer: No Typology Code available for payment source | Admitting: Gastroenterology

## 2018-05-19 VITALS — BP 166/78 | HR 85 | Ht 62.0 in | Wt 134.6 lb

## 2018-05-19 DIAGNOSIS — R748 Abnormal levels of other serum enzymes: Secondary | ICD-10-CM

## 2018-05-19 DIAGNOSIS — R5383 Other fatigue: Secondary | ICD-10-CM

## 2018-05-19 DIAGNOSIS — K8301 Primary sclerosing cholangitis: Secondary | ICD-10-CM

## 2018-05-19 LAB — CBC WITH DIFFERENTIAL/PLATELET
Basophils Absolute: 0 10*3/uL (ref 0.0–0.1)
Basophils Relative: 0.9 % (ref 0.0–3.0)
Eosinophils Absolute: 0.1 10*3/uL (ref 0.0–0.7)
Eosinophils Relative: 2.3 % (ref 0.0–5.0)
HCT: 32.6 % — ABNORMAL LOW (ref 36.0–46.0)
Hemoglobin: 10.6 g/dL — ABNORMAL LOW (ref 12.0–15.0)
Lymphocytes Relative: 44.7 % (ref 12.0–46.0)
Lymphs Abs: 1.8 10*3/uL (ref 0.7–4.0)
MCHC: 32.5 g/dL (ref 30.0–36.0)
MCV: 70 fl — ABNORMAL LOW (ref 78.0–100.0)
Monocytes Absolute: 0.5 10*3/uL (ref 0.1–1.0)
Monocytes Relative: 11.6 % (ref 3.0–12.0)
Neutro Abs: 1.6 10*3/uL (ref 1.4–7.7)
Neutrophils Relative %: 40.5 % — ABNORMAL LOW (ref 43.0–77.0)
Platelets: 161 10*3/uL (ref 150.0–400.0)
RBC: 4.66 Mil/uL (ref 3.87–5.11)
RDW: 14.6 % (ref 11.5–15.5)
WBC: 4 10*3/uL (ref 4.0–10.5)

## 2018-05-19 LAB — COMPREHENSIVE METABOLIC PANEL
ALT: 57 U/L — ABNORMAL HIGH (ref 0–35)
AST: 51 U/L — ABNORMAL HIGH (ref 0–37)
Albumin: 4.2 g/dL (ref 3.5–5.2)
Alkaline Phosphatase: 312 U/L — ABNORMAL HIGH (ref 39–117)
BUN: 16 mg/dL (ref 6–23)
CO2: 28 mEq/L (ref 19–32)
Calcium: 10.7 mg/dL — ABNORMAL HIGH (ref 8.4–10.5)
Chloride: 101 mEq/L (ref 96–112)
Creatinine, Ser: 0.71 mg/dL (ref 0.40–1.20)
GFR: 106.93 mL/min (ref >60.00)
Glucose, Bld: 114 mg/dL — ABNORMAL HIGH (ref 70–99)
Potassium: 3.9 mEq/L (ref 3.5–5.1)
Sodium: 138 mEq/L (ref 135–145)
Total Bilirubin: 0.7 mg/dL (ref 0.2–1.2)
Total Protein: 9 g/dL — ABNORMAL HIGH (ref 6.0–8.3)

## 2018-05-19 LAB — PROTIME-INR
INR: 1 ratio (ref 0.8–1.0)
Prothrombin Time: 11.5 s (ref 9.6–13.1)

## 2018-05-19 NOTE — Progress Notes (Signed)
Referring Provider: Willey Blade, MD Primary Care Physician:  Willey Blade, MD   Reason for Consultation:  Liver problem   IMPRESSION:  Chronic cholestatic liver disease, small duct PSC     - liver biopsy 11/2012: chronic biliary disease not classic for PBC    - AMA negative    - no history of IBD    - patient started Ursodiol, although not recommended by hepatologist Abnormal liver enzymes Pancreatic divisum on MRCP Fatigue, likely related to chronic liver disease H pylori + - treated with triple therapy Normal colonoscopy with Dr. 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 Will obtain and review prior serologies and biopsy results. Reviewed need for ADEK vitamin supplementation. Labs and imaging to assess for any advanced disease. Bone density testing recommended. Calcium and vitamin D supplements recommended.   Addendum: Labs from 01/14/18 show a normal CMP except for alk phos 406, TB 0.7, AST 57, ALT 63, albumin 4.3.  PLAN: - CMP, CBC, PT/INR - Defer DEXA scan to PCP (recommended in the setting of chronic cholestatic liver) - Abdominal ultrasound to evaluate for evidence of advanced liver disease - No indication for ursodiol from her history, will obtain her prior labs to make definitive recommendations  - Obtain records from Dr. Collene Mares (all office visits and colonoscopy report) and labs from Dr. Karlton Lemon (CMP, CBC, coags, vitamin D levels) - Return to this office after the ultrasound    HPI: Valerie Rojas is a 62 y.o. self-employed with Personal assistant with hx of discoid lupus and chronic cholestatic liver disease. She was previously followed by Dr. Halford Chessman with her last visit at [facility] in 2015.  Prior workup has included a liver biopsy in June of 2014 which showed chronic biliary disease not classic for PBC. She was found to be AMA negative, and has no history of IBD. She was on ursodiol for a time, this was discontinued due to nausea. An MRCP  incidentally found pancreatic divisum and gallbladder sludge, but no abnormality of the biliary tree. She reports being treated with 10 days of triple therapy for H. Pylor after a serology checked by her PCP returned positive.  She was unhappy with Dr. Veronda Prude telling her that she may need a transplant in 5 years. Looking for a change with her gastroenterologist.  She overall feels good. She continues to report intermittent fatigue.  She denies jaundice, scleral icterus, or pruritis. ntermittent heartburn. Weight has been stable. Prior abdominal bloating that has improved with dietary changes. No other associated symptoms. No identified exacerbating or relieving features.   She is currently taking Urosodiol 500 mg BID. Initially prescribed by Dr. Collene Mares and then Dr. Karlton Lemon. It was not recommended by Dr. Halford Chessman. She does not take any multivitamins except for vitamin D. Stopped taking them due to size.   No jaundice or scleral icterus. No history of cholangitis. No known metabolic bone disease but she has not had prior bone density test.   Last labs available to me are from  April 2015: Alk Phos 272, AST 49, ALT 86, T Bili 0.3. She has had labs earlier this year.   08/10/13 MRCP: 1. No MRI abnormality of the biliary tree to suggest primary sclerosis cholangitis. 2. Pancreas divisum. Main pancreatic duct is normal in caliber. 3. Gallbladder sludge.  Colonoscopy 2011 with Dr. Collene Mares - reportedly normal EGD 2011 with Dr. Collene Mares - reportedly normal    Past Medical History:  Diagnosis Date  . Discoid lupus   .  Hypertension   . Liver disease   . Other specified disorders of liver    In process of dx issues with liver/bile duct    Past Surgical History:  Procedure Laterality Date  . LIVER BIOPSY      Current Outpatient Medications  Medication Sig Dispense Refill  . hydrochlorothiazide (MICROZIDE) 12.5 MG capsule Take 12.5 mg by mouth as needed.    Marland Kitchen OVER THE COUNTER MEDICATION Place 3  drops into both eyes daily as needed (dry eye).    . Vitamin D, Ergocalciferol, (DRISDOL) 50000 UNITS CAPS Take 50,000 Units by mouth every 7 (seven) days. Takes on (or around) each Friday.     No current facility-administered medications for this visit.     Allergies as of 05/19/2018  . (No Known Allergies)    Family History  Problem Relation Age of Onset  . Heart disease Mother   . Stroke Mother   . Diabetes Father   . Diabetes Brother   . Diabetes Paternal Grandmother   . Heart disease Maternal Grandmother     Social History   Socioeconomic History  . Marital status: Married    Spouse name: Not on file  . Number of children: Not on file  . Years of education: Not on file  . Highest education Rojas: Not on file  Occupational History  . Not on file  Social Needs  . Financial resource strain: Not on file  . Food insecurity:    Worry: Not on file    Inability: Not on file  . Transportation needs:    Medical: Not on file    Non-medical: Not on file  Tobacco Use  . Smoking status: Never Smoker  . Smokeless tobacco: Never Used  Substance and Sexual Activity  . Alcohol use: No  . Drug use: No  . Sexual activity: Yes  Lifestyle  . Physical activity:    Days per week: Not on file    Minutes per session: Not on file  . Stress: Not on file  Relationships  . Social connections:    Talks on phone: Not on file    Gets together: Not on file    Attends religious service: Not on file    Active member of club or organization: Not on file    Attends meetings of clubs or organizations: Not on file    Relationship status: Not on file  . Intimate partner violence:    Fear of current or ex partner: Not on file    Emotionally abused: Not on file    Physically abused: Not on file    Forced sexual activity: Not on file  Other Topics Concern  . Not on file  Social History Narrative  . Not on file    Review of Systems: 12 system ROS is negative except as noted above.   Filed Weights   05/19/18 0922  Weight: 134 lb 9.6 oz (61.1 kg)    Physical Exam: Vital signs were reviewed.  General:   Alert, in NAD. No scleral icterus. No bilateral temporal wasting.  Heart:  Regular rate and rhythm; no murmurs Pulm: Clear anteriorly; no wheezing Abdomen:  Soft. Nontender. Nondistended. Normal bowel sounds. No rebound or guarding. No fluid wave. Liver edge is palpable two fingerbreaths below the costal margin in the midclavicular line. Spleen tip not palpable.  LAD: No inguinal or umbilical LAD Extremities:  Without edema. Neurologic:  Alert and  oriented x4;  grossly normal neurologically; no asterixis or clonus. Skin: No  jaundice. No palmar erythema or spider angioma.  Psych:  Alert and cooperative. Normal mood and affect.   Ndrew Creason L. Tarri Glenn, MD, MPH [facility] 05/19/2018, 7:37 PM

## 2018-05-19 NOTE — Patient Instructions (Signed)
Your provider has requested that you go to the basement level for lab work before leaving today. Press "B" on the elevator. The lab is located at the first door on the left as you exit the elevator.  You have been scheduled for an abdominal ultrasound at [facility] (1st floor of hospital) on 05-21-18 at 8:30 am. Please arrive 15 minutes prior to your appointment for registration. Make certain not to have anything to eat or drink 6 hours prior to your appointment. Should you need to reschedule your appointment, please contact radiology at [phone]. This test typically takes about 30 minutes to perform.

## 2018-05-21 ENCOUNTER — Ambulatory Visit (HOSPITAL_COMMUNITY)
Admission: RE | Admit: 2018-05-21 | Discharge: 2018-05-21 | Disposition: A | Discharge status: Home / Self Care | Payer: No Typology Code available for payment source | Source: Ambulatory Visit | Attending: Gastroenterology | Admitting: Gastroenterology

## 2018-05-21 DIAGNOSIS — R748 Abnormal levels of other serum enzymes: Secondary | ICD-10-CM | POA: Diagnosis present

## 2019-09-15 ENCOUNTER — Ambulatory Visit: Payer: Self-pay | Attending: Internal Medicine

## 2019-09-15 DIAGNOSIS — Z23 Encounter for immunization: Secondary | ICD-10-CM

## 2019-09-15 NOTE — Progress Notes (Signed)
   Covid-19 Vaccination Clinic  Name:  KOUTURE LANDIN    MRN: [id number] DOB: 03-14-1956  09/15/2019  Ms. Dapice was observed post Covid-19 immunization for 15 minutes without incident. She was provided with Vaccine Information Sheet and instruction to access the V-Safe system.   Ms. Sou was instructed to call 911 with any severe reactions post vaccine: Marland Kitchen Difficulty breathing  . Swelling of face and throat  . A fast heartbeat  . A bad rash all over body  . Dizziness and weakness   Immunizations Administered    Name Date Dose VIS Date Route   Pfizer COVID-19 Vaccine 09/15/2019 11:33 AM 0.3 mL 06/10/2019 Intramuscular   Manufacturer: [place]   Lot: [id number]   [place]: [id number]

## 2019-10-11 ENCOUNTER — Ambulatory Visit: Payer: Self-pay | Attending: Internal Medicine

## 2019-10-11 DIAGNOSIS — Z23 Encounter for immunization: Secondary | ICD-10-CM

## 2019-10-11 NOTE — Progress Notes (Signed)
   Covid-19 Vaccination Clinic  Name:  Valerie Rojas    MRN: [id number] DOB: Oct 13, 1955  10/11/2019  [name] was observed post Covid-19 immunization for 15 minutes without incident. She was provided with Vaccine Information Sheet and instruction to access the V-Safe system.   [name] was instructed to call 911 with any severe reactions post vaccine: Marland Kitchen Difficulty breathing  . Swelling of face and throat  . A fast heartbeat  . A bad rash all over body  . Dizziness and weakness   Immunizations Administered    Name Date Dose VIS Date Route   Pfizer COVID-19 Vaccine 10/11/2019 11:39 AM 0.3 mL 06/10/2019 Intramuscular   Manufacturer: [place]   Lot: [id number]   [place]: [id number]

## 2019-10-18 ENCOUNTER — Ambulatory Visit: Payer: Self-pay

## 2020-04-14 ENCOUNTER — Ambulatory Visit: Payer: Self-pay | Attending: Internal Medicine

## 2020-04-14 ENCOUNTER — Other Ambulatory Visit: Payer: Self-pay

## 2020-04-14 DIAGNOSIS — Z23 Encounter for immunization: Secondary | ICD-10-CM

## 2020-04-14 NOTE — Progress Notes (Signed)
   Covid-19 Vaccination Clinic  Name:  Valerie Rojas    MRN: [id number] DOB: 06/28/1956  04/14/2020  Valerie Rojas was observed post Covid-19 immunization for 15 minutes without incident. She was provided with Vaccine Information Sheet and instruction to access the V-Safe system.   Valerie Rojas was instructed to call 911 with any severe reactions post vaccine: Marland Kitchen Difficulty breathing  . Swelling of face and throat  . A fast heartbeat  . A bad rash all over body  . Dizziness and weakness

## 2020-07-12 ENCOUNTER — Other Ambulatory Visit: Payer: Self-pay | Admitting: Internal Medicine

## 2020-07-12 DIAGNOSIS — Z1231 Encounter for screening mammogram for malignant neoplasm of breast: Secondary | ICD-10-CM

## 2020-07-12 DIAGNOSIS — E2839 Other primary ovarian failure: Secondary | ICD-10-CM

## 2020-08-13 ENCOUNTER — Encounter: Payer: Self-pay | Admitting: Gastroenterology

## 2020-08-24 ENCOUNTER — Ambulatory Visit: Payer: Self-pay

## 2020-08-27 ENCOUNTER — Ambulatory Visit: Payer: Self-pay

## 2020-08-30 ENCOUNTER — Ambulatory Visit
Admission: RE | Admit: 2020-08-30 | Discharge: 2020-08-30 | Disposition: A | Discharge status: Home / Self Care | Payer: Self-pay | Source: Ambulatory Visit | Attending: Internal Medicine | Admitting: Internal Medicine

## 2020-08-30 ENCOUNTER — Other Ambulatory Visit: Payer: Self-pay

## 2020-08-30 DIAGNOSIS — Z1231 Encounter for screening mammogram for malignant neoplasm of breast: Secondary | ICD-10-CM

## 2020-09-20 ENCOUNTER — Other Ambulatory Visit: Payer: Self-pay

## 2020-09-20 ENCOUNTER — Ambulatory Visit (AMBULATORY_SURGERY_CENTER): Payer: Self-pay

## 2020-09-20 VITALS — Ht 62.0 in | Wt 127.6 lb

## 2020-09-20 DIAGNOSIS — Z1211 Encounter for screening for malignant neoplasm of colon: Secondary | ICD-10-CM

## 2020-09-20 MED ORDER — PEG 3350-KCL-NA BICARB-NACL 420 G PO SOLR: 4000.0000 mL | Freq: Once | ORAL | 0 refills | Status: AC

## 2020-09-20 NOTE — Progress Notes (Signed)

## 2020-10-02 ENCOUNTER — Encounter: Payer: Self-pay | Admitting: Gastroenterology

## 2020-10-04 ENCOUNTER — Other Ambulatory Visit: Payer: Self-pay

## 2020-10-04 ENCOUNTER — Ambulatory Visit (AMBULATORY_SURGERY_CENTER): Payer: Medicare Other | Admitting: Gastroenterology

## 2020-10-04 ENCOUNTER — Encounter: Payer: Self-pay | Admitting: Gastroenterology

## 2020-10-04 VITALS — BP 128/70 | HR 63 | Temp 95.6°F | Resp 16 | Ht 62.0 in | Wt 127.0 lb

## 2020-10-04 DIAGNOSIS — Z1211 Encounter for screening for malignant neoplasm of colon: Secondary | ICD-10-CM

## 2020-10-04 DIAGNOSIS — D123 Benign neoplasm of transverse colon: Secondary | ICD-10-CM

## 2020-10-04 DIAGNOSIS — K635 Polyp of colon: Secondary | ICD-10-CM

## 2020-10-04 DIAGNOSIS — D120 Benign neoplasm of cecum: Secondary | ICD-10-CM

## 2020-10-04 MED ORDER — SODIUM CHLORIDE 0.9 % IV SOLN: 500.0000 mL | Freq: Once | INTRAVENOUS | Status: AC

## 2020-10-04 NOTE — Patient Instructions (Signed)
Resume previous diet and medications. Awaiting pathology results. Repeat colonoscopy date to be determined after path results reviewed for surveillance.  YOU HAD AN ENDOSCOPIC PROCEDURE TODAY AT [facility]:   Refer to the procedure report that was given to you for any specific questions about what was found during the examination.  If the procedure report does not answer your questions, please call your gastroenterologist to clarify.  If you requested that your care partner not be given the details of your procedure findings, then the procedure report has been included in a sealed envelope for you to review at your convenience later.  YOU SHOULD EXPECT: Some feelings of bloating in the abdomen. Passage of more gas than usual.  Walking can help get rid of the air that was put into your GI tract during the procedure and reduce the bloating. If you had a lower endoscopy (such as a colonoscopy or flexible sigmoidoscopy) you may notice spotting of blood in your stool or on the toilet paper. If you underwent a bowel prep for your procedure, you may not have a normal bowel movement for a few days.  Please Note:  You might notice some irritation and congestion in your nose or some drainage.  This is from the oxygen used during your procedure.  There is no need for concern and it should clear up in a day or so.  SYMPTOMS TO REPORT IMMEDIATELY:   Following lower endoscopy (colonoscopy or flexible sigmoidoscopy):  Excessive amounts of blood in the stool  Significant tenderness or worsening of abdominal pains  Swelling of the abdomen that is new, acute  Fever of 100F or higher   For urgent or emergent issues, a gastroenterologist can be reached at any hour by calling [phone]. Do not use MyChart messaging for urgent concerns.    DIET:  We do recommend a small meal at first, but then you may proceed to your regular diet.  Drink plenty of fluids but you should avoid alcoholic  beverages for 24 hours.  ACTIVITY:  You should plan to take it easy for the rest of today and you should NOT DRIVE or use heavy machinery until tomorrow (because of the sedation medicines used during the test).    FOLLOW UP: Our staff will call the number listed on your records 48-72 hours following your procedure to check on you and address any questions or concerns that you may have regarding the information given to you following your procedure. If we do not reach you, we will leave a message.  We will attempt to reach you two times.  During this call, we will ask if you have developed any symptoms of COVID 19. If you develop any symptoms (ie: fever, flu-like symptoms, shortness of breath, cough etc.) before then, please call [phone].  If you test positive for Covid 19 in the 2 weeks post procedure, please call and report this information to [place].    If any biopsies were taken you will be contacted by phone or by letter within the next 1-3 weeks.  Please call us at [phone] if you have not heard about the biopsies in 3 weeks.    SIGNATURES/CONFIDENTIALITY: You and/or your care partner have signed paperwork which will be entered into your electronic medical record.  These signatures attest to the fact that that the information above on your After Visit Summary has been reviewed and is understood.  Full responsibility of the confidentiality of this discharge information lies with you  and/or your care-partner. 

## 2020-10-04 NOTE — Progress Notes (Signed)
[facility]  Pt's states no medical or surgical changes since previsit or office visit.  

## 2020-10-04 NOTE — Progress Notes (Signed)
Report given to PACU, vss 

## 2020-10-04 NOTE — Op Note (Signed)
[facility] Patient Name: Valerie Rojas Procedure Date: 10/04/2020 10:34 AM MRN: [id number] Endoscopist: Thornton Park MD, MD Age: 65 Referring MD:  Date of Birth: 1956/06/15 Gender: Female Account #: [id number] Procedure:                Colonoscopy Indications:              Screening for colorectal malignant neoplasm                           Normal colonoscopy with Dr. Collene Mares 2011 Medicines:                Monitored Anesthesia Care Procedure:                Pre-Anesthesia Assessment:                           - Prior to the procedure, a History and Physical                            was performed, and patient medications and                            allergies were reviewed. The patient's tolerance of                            previous anesthesia was also reviewed. The risks                            and benefits of the procedure and the sedation                            options and risks were discussed with the patient.                            All questions were answered, and informed consent                            was obtained. Prior Anticoagulants: The patient has                            taken no previous anticoagulant or antiplatelet                            agents. ASA Grade Assessment: II - A patient with                            mild systemic disease. After reviewing the risks                            and benefits, the patient was deemed in                            satisfactory condition to undergo the procedure.  After obtaining informed consent, the colonoscope                            was passed under direct vision. Throughout the                            procedure, the patient's blood pressure, pulse, and                            oxygen saturations were monitored continuously. The                            Olympus CF-HQ190L ([id number]) Colonoscope was                            introduced through the anus  and advanced to the 3                            cm into the ileum. The colonoscopy was performed                            with moderate difficulty due to a redundant colon,                            significant looping and a tortuous colon.                            Successful completion of the procedure was aided by                            applying abdominal pressure. The patient tolerated                            the procedure well. The quality of the bowel                            preparation was good. The terminal ileum, ileocecal                            valve, appendiceal orifice, and rectum were                            photographed. Scope In: 10:43:03 AM Scope Out: 10:58:33 AM Scope Withdrawal Time: 0 hours 10 minutes 29 seconds  Total Procedure Duration: 0 hours 15 minutes 30 seconds  Findings:                 The perianal and digital rectal examinations were                            normal.                           A 1 mm polyp was found in the transverse colon. The  polyp was flat. The polyp was removed with a cold                            biopsy forceps. Resection and retrieval were                            complete. Estimated blood loss was minimal.                           A <58mm polyp was found in the cecum. The polyp was                            flat. The polyp was removed with a cold biopsy                            forceps. Resection and retrieval were complete.                            Estimated blood loss was minimal.                           The exam was otherwise without abnormality on                            direct and retroflexion views except for internal                            hemorrhoids. Complications:            No immediate complications. Estimated blood loss:                            Minimal. Estimated Blood Loss:     Estimated blood loss was minimal. Impression:               - One 1 mm  polyp in the transverse colon, removed                            with a cold biopsy forceps. Resected and retrieved.                           - One less than 1 mm polyp in the transverse colon,                            removed with a cold biopsy forceps. Resected and                            retrieved.                           - The examination was otherwise normal on direct                            and retroflexion views. Recommendation:           -  Patient has a contact number available for                            emergencies. The signs and symptoms of potential                            delayed complications were discussed with the                            patient. Return to normal activities tomorrow.                            Written discharge instructions were provided to the                            patient.                           - Resume previous diet.                           - Continue present medications.                           - Await pathology results.                           - Repeat colonoscopy date to be determined after                            pending pathology results are reviewed for                            surveillance.                           - Emerging evidence supports eating a diet of                            fruits, vegetables, grains, calcium, and yogurt                            while reducing red meat and alcohol may reduce the                            risk of colon cancer.                           - Thank you for allowing me to be involved in your                            colon cancer prevention. Thornton Park MD, MD 10/04/2020 11:11:23 AM This report has been signed electronically.

## 2020-10-04 NOTE — Progress Notes (Signed)
Called to room to assist during endoscopic procedure.  Patient ID and intended procedure confirmed with present staff. Received instructions for my participation in the procedure from the performing physician.  

## 2020-10-08 ENCOUNTER — Telehealth: Payer: Self-pay

## 2020-10-08 NOTE — Telephone Encounter (Signed)
  Follow up Call-  Call back number 10/04/2020  Post procedure Call Back phone  # [phone]  Permission to leave phone message Yes  Some recent data might be hidden     Patient questions:  Do you have a fever, pain , or abdominal swelling? No. Pain Score  0 *  Have you tolerated food without any problems? Yes.    Have you been able to return to your normal activities? Yes.    Do you have any questions about your discharge instructions: Diet   No. Medications  No. Follow up visit  No.  Do you have questions or concerns about your Care? No.  Actions: * If pain score is 4 or above: No action needed, pain <4.

## 2020-10-15 ENCOUNTER — Encounter: Payer: Self-pay | Admitting: Gastroenterology

## 2020-11-02 ENCOUNTER — Other Ambulatory Visit: Payer: Self-pay

## 2020-11-02 ENCOUNTER — Ambulatory Visit
Admission: RE | Admit: 2020-11-02 | Discharge: 2020-11-02 | Disposition: A | Discharge status: Home / Self Care | Payer: Medicare Other | Source: Ambulatory Visit | Attending: Internal Medicine | Admitting: Internal Medicine

## 2020-11-02 DIAGNOSIS — E2839 Other primary ovarian failure: Secondary | ICD-10-CM

## 2020-11-09 ENCOUNTER — Encounter (HOSPITAL_COMMUNITY): Payer: Self-pay | Admitting: Emergency Medicine

## 2020-11-09 ENCOUNTER — Emergency Department (HOSPITAL_COMMUNITY)
Admission: EM | Admit: 2020-11-09 | Discharge: 2020-11-10 | Disposition: A | Discharge status: Home / Self Care | Payer: Medicare Other | Attending: Emergency Medicine | Admitting: Emergency Medicine

## 2020-11-09 ENCOUNTER — Other Ambulatory Visit: Payer: Self-pay

## 2020-11-09 DIAGNOSIS — M25572 Pain in left ankle and joints of left foot: Secondary | ICD-10-CM | POA: Insufficient documentation

## 2020-11-09 DIAGNOSIS — M79605 Pain in left leg: Secondary | ICD-10-CM

## 2020-11-09 DIAGNOSIS — M79672 Pain in left foot: Secondary | ICD-10-CM | POA: Insufficient documentation

## 2020-11-09 DIAGNOSIS — I10 Essential (primary) hypertension: Secondary | ICD-10-CM | POA: Insufficient documentation

## 2020-11-09 DIAGNOSIS — Z79899 Other long term (current) drug therapy: Secondary | ICD-10-CM | POA: Diagnosis not present

## 2020-11-09 NOTE — ED Triage Notes (Signed)
Patient comes from home by [facility]. Patient is having pain going from left foot to left knee. No hx of dvt. No injuries. Started around 5 pm today.

## 2020-11-09 NOTE — ED Provider Notes (Signed)
Emergency Medicine Provider Triage Evaluation Note  Valerie Rojas , a 65 y.o. female  was evaluated in triage.  Pt complains of left foot swelling and pain. Brought in by EMS. No injuries. Sitting at a computer for 4 hours, pain in the foot to the knee when she went to stand up and started walking around. Found to have swelling to her ankle. No history DVT.  Review of Systems  Positive: Foot pain, swelling Negative: numbness  Physical Exam  There were no vitals taken for this visit. Gen:   Awake, no distress   Resp:  Normal effort  MSK:   Moves extremities without difficulty  Other:  Diffuse left foot pain with palpation, DP pulse present, sensation intact.   Medical Decision Making  Medically screening exam initiated at 9:17 PM.  Appropriate orders placed.  Valerie Rojas was informed that the remainder of the evaluation will be completed by another provider, this initial triage assessment does not replace that evaluation, and the importance of remaining in the ED until their evaluation is complete.     Tacy Learn, PA-C 11/09/20 2127    Daleen Bo, MD 11/09/20 [phone]

## 2020-11-10 ENCOUNTER — Emergency Department (HOSPITAL_COMMUNITY)
Admission: EM | Admit: 2020-11-10 | Discharge: 2020-11-10 | Discharge status: Absent without leave | Payer: Medicare Other

## 2020-11-10 ENCOUNTER — Ambulatory Visit (HOSPITAL_BASED_OUTPATIENT_CLINIC_OR_DEPARTMENT_OTHER)
Admission: RE | Admit: 2020-11-10 | Discharge: 2020-11-10 | Disposition: A | Discharge status: Home / Self Care | Payer: Medicare Other | Source: Ambulatory Visit

## 2020-11-10 ENCOUNTER — Emergency Department (HOSPITAL_COMMUNITY): Payer: Medicare Other

## 2020-11-10 DIAGNOSIS — M79672 Pain in left foot: Secondary | ICD-10-CM | POA: Insufficient documentation

## 2020-11-10 DIAGNOSIS — R531 Weakness: Secondary | ICD-10-CM

## 2020-11-10 DIAGNOSIS — I160 Hypertensive urgency: Secondary | ICD-10-CM | POA: Diagnosis not present

## 2020-11-10 DIAGNOSIS — R202 Paresthesia of skin: Secondary | ICD-10-CM | POA: Insufficient documentation

## 2020-11-10 DIAGNOSIS — I161 Hypertensive emergency: Secondary | ICD-10-CM | POA: Insufficient documentation

## 2020-11-10 DIAGNOSIS — M7989 Other specified soft tissue disorders: Secondary | ICD-10-CM | POA: Insufficient documentation

## 2020-11-10 NOTE — ED Provider Notes (Signed)
[facility] DEPT Provider Note   CSN: [id number] Arrival date & time: 11/09/20  2114     History Chief Complaint  Patient presents with  . Foot Injury    Valerie Rojas is a 65 y.o. female.  Patient presents to the emergency department with a chief complaint of left foot and ankle pain.  She reports gradually worsening pain over the past day or so.  She states that she has left calf pain.  She denies any known injury or trouble.  She states that the pain is worsened when she stands up and when she bends her ankle.  She denies history of gout.  Denies history of DVT.  Denies any successful treatments prior to arrival.  The history is provided by the patient. No language interpreter was used.       Past Medical History:  Diagnosis Date  . Anemia    pt reports a while back  . Discoid lupus   . Hyperlipidemia    high level on meds to prevent getting worse  . Hypertension   . Liver disease   . Other specified disorders of liver    In process of dx issues with liver/bile duct    Patient Active Problem List   Diagnosis Date Noted  . Elevated liver enzymes 04/03/2013  . Anemia 04/03/2013  . GERD 09/02/2007  . CHEST PAIN UNSPECIFIED 09/02/2007    Past Surgical History:  Procedure Laterality Date  . COLONOSCOPY  2011-[facility]  . LIVER BIOPSY       OB History   No obstetric history on file.     Family History  Problem Relation Age of Onset  . Heart disease Mother   . Stroke Mother   . Diabetes Father   . Diabetes Brother   . Diabetes Paternal Grandmother   . Heart disease Maternal Grandmother   . Colon cancer Paternal Aunt        19's at dx  . Esophageal cancer Neg Hx   . Rectal cancer Neg Hx   . Stomach cancer Neg Hx   . Colon polyps Neg Hx     Social History   Tobacco Use  . Smoking status: Never Smoker  . Smokeless tobacco: Never Used  Vaping Use  . Vaping Use: Never used  Substance Use Topics  . Alcohol use: No  . Drug  use: No    Home Medications Prior to Admission medications   Medication Sig Start Date End Date Taking? Authorizing Provider  amLODipine (NORVASC) 5 MG tablet Take 5 mg by mouth daily.    [provider]  fenofibrate 160 MG tablet Take 160 mg by mouth daily.    [provider]  Vitamin D, Ergocalciferol, (DRISDOL) 50000 UNITS CAPS Take 50,000 Units by mouth every 7 (seven) days. Takes on (or around) each Friday.    [provider]    Allergies    Patient has no known allergies.  Review of Systems   Review of Systems  All other systems reviewed and are negative.   Physical Exam Updated Vital Signs BP (!) 188/75   Pulse 87   Temp 98.2 F (36.8 C) (Oral)   Resp 16   Ht 5\' 2"  (1.575 m)   Wt 57.6 kg   SpO2 100%   BMI 23.23 kg/m   Physical Exam Nursing note and vitals reviewed.  Constitutional: Pt appears well-developed and well-nourished. No distress.  HENT:  Head: Normocephalic and atraumatic.  Eyes: Conjunctivae are normal.  Neck: Normal range of motion.  Cardiovascular: Normal rate, regular rhythm. Intact distal pulses.   Capillary refill < 3 sec.  Pulmonary/Chest: Effort normal and breath sounds normal.  Musculoskeletal:  Left ankle and calf Pt exhibits some mild TTP to anterior ankle and posterior calf.   ROM: 4/5  Strength: 4/5  Neurological: Pt  is alert. Coordination normal.  Sensation: 5/5 Skin: Skin is warm and dry. Pt is not diaphoretic.  No evidence of open wound or skin tenting Psychiatric: Pt has a normal mood and affect.    ED Results / Procedures / Treatments   Labs (all labs ordered are listed, but only abnormal results are displayed) Labs Reviewed - No data to display  EKG None  Radiology DG Ankle Complete Left  Result Date: 11/10/2020 CLINICAL DATA:  Left ankle and foot pain and swelling EXAM: LEFT ANKLE COMPLETE - 3+ VIEW COMPARISON:  None. FINDINGS: There is no evidence of acute fracture, dislocation, or  joint effusion. Productive change along the medial malleolus with a well corticated fragment along the distal aspect of the medial malleolus which may be sequela prior trauma. Calcaneal enthesophytes. Soft tissues are unremarkable. IMPRESSION: Likely degenerative and/or remote posttraumatic changes at the medial malleolus, recommend correlation with point tenderness to palpation to assess acuity. No definite evidence of acute osseous abnormality. Electronically Signed   By: Dahlia Bailiff MD   On: 11/10/2020 02:19   DG Foot Complete Left  Result Date: 11/10/2020 CLINICAL DATA:  Pain and swelling of the foot and ankle EXAM: LEFT FOOT - COMPLETE 3+ VIEW COMPARISON:  None. FINDINGS: There is no evidence of fracture or dislocation. Dorsal midfoot degenerative change. Calcaneal enthesophytes. Soft tissues are unremarkable. IMPRESSION: No acute osseous abnormality visualized. Electronically Signed   By: Dahlia Bailiff MD   On: 11/10/2020 02:20    Procedures Procedures   Medications Ordered in ED Medications - No data to display  ED Course  I have reviewed the triage vital signs and the nursing notes.  Pertinent labs & imaging results that were available during my care of the patient were reviewed by me and considered in my medical decision making (see chart for details).    MDM Rules/Calculators/A&P                          Patient presents with pain in left ankle and calf.  DDx includes, fracture, strain, or sprain.  Consultants: none  Plain films reveal degenerative changes, no acute traumatic injury.  Pt advised to follow up with PCP and/or orthopedics. Patient given a ankle ASO and crutches while in ED, conservative therapy such as RICE recommended and discussed.   I have ordered an outpatient DVT study for the patient.  She will return in a few hours to have this done.  Patient will be discharged home & is agreeable with above plan. Returns precautions discussed. Pt appears safe for  discharge.  Final Clinical Impression(s) / ED Diagnoses Final diagnoses:  Pain of left lower extremity    Rx / DC Orders ED Discharge Orders    None       Lyfe, Reihl, PA-C 11/10/20 0239    Ezequiel Essex, MD 11/10/20 [phone]

## 2020-11-10 NOTE — Discharge Instructions (Signed)
Your x-ray showed some degenerative changes.  I do recommend that you have an ultrasound of your leg later this morning.  Please follow the instructions listed.  Please use the ankle brace and crutches.  Please follow-up with your doctor or with an orthopedic doctor if the ultrasound is negative.  If the ultrasound shows a blood clot, you will need to come back to the emergency department.

## 2020-11-10 NOTE — Progress Notes (Signed)
VASCULAR LAB    Left lower extremity venous duplex has been performed.  See CV proc for preliminary results.   Psalm Arman, RVT 11/10/2020, 12:04 PM

## 2021-02-04 ENCOUNTER — Encounter: Payer: Self-pay | Admitting: Gastroenterology

## 2021-02-04 ENCOUNTER — Ambulatory Visit: Payer: Medicare Other | Admitting: Gastroenterology

## 2021-02-04 ENCOUNTER — Other Ambulatory Visit (INDEPENDENT_AMBULATORY_CARE_PROVIDER_SITE_OTHER): Payer: Medicare Other

## 2021-02-04 VITALS — BP 170/84 | HR 80 | Ht 62.0 in | Wt 126.4 lb

## 2021-02-04 DIAGNOSIS — K831 Obstruction of bile duct: Secondary | ICD-10-CM

## 2021-02-04 DIAGNOSIS — R748 Abnormal levels of other serum enzymes: Secondary | ICD-10-CM | POA: Diagnosis not present

## 2021-02-04 LAB — CBC WITH DIFFERENTIAL/PLATELET
Basophils Absolute: 0 10*3/uL (ref 0.0–0.1)
Basophils Relative: 1.3 % (ref 0.0–3.0)
Eosinophils Absolute: 0.1 10*3/uL (ref 0.0–0.7)
Eosinophils Relative: 1.8 % (ref 0.0–5.0)
HCT: 31.1 % — ABNORMAL LOW (ref 36.0–46.0)
Hemoglobin: 10 g/dL — ABNORMAL LOW (ref 12.0–15.0)
Lymphocytes Relative: 49 % — ABNORMAL HIGH (ref 12.0–46.0)
Lymphs Abs: 1.7 10*3/uL (ref 0.7–4.0)
MCHC: 32.1 g/dL (ref 30.0–36.0)
MCV: 70.4 fl — ABNORMAL LOW (ref 78.0–100.0)
Monocytes Absolute: 0.3 10*3/uL (ref 0.1–1.0)
Monocytes Relative: 8.3 % (ref 3.0–12.0)
Neutro Abs: 1.4 10*3/uL (ref 1.4–7.7)
Neutrophils Relative %: 39.6 % — ABNORMAL LOW (ref 43.0–77.0)
Platelets: 118 10*3/uL — ABNORMAL LOW (ref 150.0–400.0)
RBC: 4.42 Mil/uL (ref 3.87–5.11)
RDW: 15.6 % — ABNORMAL HIGH (ref 11.5–15.5)
WBC: 3.5 10*3/uL — ABNORMAL LOW (ref 4.0–10.5)

## 2021-02-04 LAB — PROTIME-INR
INR: 1 ratio (ref 0.8–1.0)
Prothrombin Time: 11.5 s (ref 9.6–13.1)

## 2021-02-04 NOTE — Patient Instructions (Signed)
Your provider has requested that you go to the basement level for lab work before leaving today. Press "B" on the elevator. The lab is located at the first door on the left as you exit the elevator.  You have been scheduled for an abdominal ultrasound at [facility] (1st floor of hospital) on 02/07/21 at 9:30am. Please arrive 15 minutes prior to your appointment for registration. Make certain not to have anything to eat or drink 6 hours prior to your appointment. Should you need to reschedule your appointment, please contact radiology at [phone]. This test typically takes about 30 minutes to perform.  The [facility] providers would like to encourage you to use [facility] to communicate with providers for non-urgent requests or questions.  Due to long hold times on the telephone, sending your provider a message by [facility] may be a faster and more efficient way to get a response.  Please allow 48 business hours for a response.  Please remember that this is for non-urgent requests.   Due to recent changes in healthcare laws, you may see the results of your imaging and laboratory studies on MyChart before your provider has had a chance to review them.  We understand that in some cases there may be results that are confusing or concerning to you. Not all laboratory results come back in the same time frame and the provider may be waiting for multiple results in order to interpret others.  Please give [place] 48 hours in order for your provider to thoroughly review all the results before contacting the office for clarification of your results.

## 2021-02-04 NOTE — Progress Notes (Signed)
Referring Provider: Willey Blade, MD Primary Care Physician:  Willey Blade, MD   Chief complaint:  Liver problem   IMPRESSION:  Small duct PSC with chronic cholestasis    - liver biopsy 11/2012: chronic biliary disease not classic for PBC    - AMA negative    - no history of IBD    - patient started Ursodiol, although not recommended by hepatologist    - associated metabolic bone disease Thrombocytopenia noted on recent labs suggested advanced liver disease/cirrhosis Abnormal liver enzymes due to liver disease Pancreatic divisum on MRCP Fatigue, likely related to chronic liver disease H pylori + - treated with triple therapy Normal colonoscopy with Dr. 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 Will obtain and review prior serologies and biopsy results. Reviewed need for ADEK vitamin supplementation. Labs and imaging to assess for any advanced disease. Calcium and vitamin D supplements recommended. Immunizations and rationale in the setting of liver disease reviewed, although she declines.   Addendum: Labs from 01/14/18 show a normal CMP except for alk phos 406, TB 0.7, AST 57, ALT 63, albumin 4.3.  PLAN: - CBC, PT/INR, AFP to evaluate synthetic function - Abdominal ultrasound to evaluate for evidence of advanced liver disease - If advanced disease on ultrasound, will need EGD to screen for varices - No indication for ursodiol from her history - Treatment of metabolic bone disease per Dr. Karlton Lemon - Return to this office after the ultrasound    HPI: Valerie Rojas is a 65 y.o. self-employed with real Environmental education officer with hx of discoid lupus and chronic cholestatic liver disease. I initially met her in 2019 but she did not return in follow-up. Had a screening colonoscopy with me 10/04/20. I recommended office follow-up for her liver disease at that time. Her husband died in 04-28-2023 and she has been trying to take better care of herself since his death.   She was  previously followed by Dr. Halford Chessman with her last visit at [facility] in 2015.  Prior workup has included a liver biopsy in June of 2014 which showed chronic biliary disease not classic for PBC. She was found to be AMA negative, and has no history of IBD. She was on ursodiol for a time, this was discontinued due to nausea. An MRCP incidentally found pancreatic divisum and gallbladder sludge, but no abnormality of the biliary tree. She reports being treated with 10 days of triple therapy for H. Pylor after a serology checked by her PCP returned positive.  Has severe fatigue. Does Tai Chi every morning and deep breathing.  She denies jaundice, scleral icterus, or pruritis. Weight has been stable. Prior abdominal bloating that has improved with dietary changes. No other associated symptoms. No identified exacerbating or relieving features.   She continues taking Urosodiol 500 mg BID. Initially prescribed by Dr. Collene Mares and then Dr. Karlton Lemon. It was not recommended by Dr. Halford Chessman. She does not take any multivitamins except for vitamin D. Stopped taking them due to size.  Does not get the flu vaccine. Has completed Covid vaccine including the booster. Has not had the Pneumovax. She doesn't want to get vaccines. She avoids being around people.   Lab review includes: - April 2015: Alk Phos 272, AST 49, ALT 86, T Bili 0.3. She has had labs earlier this year.  - 01/14/18 show a normal CMP except for alk phos 406, TB 0.7, AST 57, ALT 63, albumin 4.3. - 07/11/20: 3.8, hgb 10.8, platelets 132 - 01/07/21: TB 0.9,  AST 48, ALT 35, alk phos 360, direct bilirubin 0.58, GGTP 516, LDH 192  08/10/13 MRCP: 1. No MRI abnormality of the biliary tree to suggest primary sclerosis cholangitis. 2. Pancreas divisum. Main pancreatic duct is normal in caliber. 3. Gallbladder sludge.  Endoscopic history: Colonoscopy 2011 with Dr. Collene Mares - reportedly normal EGD 2011 with Dr. Collene Mares - reportedly normal Colonoscopy 10/04/20: polypoid  mucosa, no adenomas  Bone density testing 11/02/20: osteopenia/low bone mass  Past Medical History:  Diagnosis Date   Anemia    pt reports a while back   Discoid lupus    Hyperlipidemia    high level on meds to prevent getting worse   Hypertension    Liver disease    Other specified disorders of liver    In process of dx issues with liver/bile duct    Past Surgical History:  Procedure Laterality Date   COLONOSCOPY  2011-[facility]   LIVER BIOPSY      Current Outpatient Medications  Medication Sig Dispense Refill   amLODipine (NORVASC) 5 MG tablet Take 5 mg by mouth daily.     fenofibrate 160 MG tablet Take 160 mg by mouth daily.     ursodiol (ACTIGALL) 500 MG tablet Take 500 mg by mouth 2 (two) times daily.     Vitamin D, Ergocalciferol, (DRISDOL) 50000 UNITS CAPS Take 50,000 Units by mouth every 7 (seven) days. Takes on (or around) each Friday.     EPINEPHrine 0.3 mg/0.3 mL IJ SOAJ injection as needed. (Patient not taking: Reported on 02/04/2021)     Current Facility-Administered Medications  Medication Dose Route Frequency Provider Last Rate Last Admin   0.9 %  sodium chloride infusion  500 mL Intravenous Once Thornton Park, MD        Allergies as of 02/04/2021   (No Known Allergies)    Family History  Problem Relation Age of Onset   Heart disease Mother    Stroke Mother    Diabetes Father    Diabetes Brother    Diabetes Paternal Grandmother    Heart disease Maternal Grandmother    Colon cancer Paternal Aunt        37's at dx   Esophageal cancer Neg Hx    Rectal cancer Neg Hx    Stomach cancer Neg Hx    Colon polyps Neg Hx     Social History   Socioeconomic History   Marital status: Widowed    Spouse name: Not on file   Number of children: Not on file   Years of education: Not on file   Highest education level: Not on file  Occupational History   Not on file  Tobacco Use   Smoking status: Never   Smokeless tobacco: Never  Vaping Use   Vaping Use:  Never used  Substance and Sexual Activity   Alcohol use: No   Drug use: No   Sexual activity: Yes  Other Topics Concern   Not on file  Social History Narrative   Not on file   Social Determinants of Health   Financial Resource Strain: Not on file  Food Insecurity: Not on file  Transportation Needs: Not on file  Physical Activity: Not on file  Stress: Not on file  Social Connections: Not on file  Intimate Partner Violence: Not on file    Review of Systems: 12 system ROS is negative except as noted above.  Filed Weights   02/04/21 1033  Weight: 126 lb 6 oz (57.3 kg)    Physical  Exam: Vital signs were reviewed.  General:   Alert, in NAD. No scleral icterus. No bilateral temporal wasting.  Heart:  Regular rate and rhythm; no murmurs Pulm: Clear anteriorly; no wheezing Abdomen:  Soft. Nontender. Nondistended. Normal bowel sounds. No rebound or guarding. No fluid wave. Liver edge is palpable two fingerbreaths below the costal margin in the midclavicular line. Spleen tip not palpable.  LAD: No inguinal or umbilical LAD Extremities:  Without edema. Neurologic:  Alert and  oriented x4;  grossly normal neurologically; no asterixis or clonus. Skin: No jaundice. No palmar erythema or spider angioma.  Psych:  Alert and cooperative. Normal mood and affect.   Rahm Minix L. Tarri Glenn, MD, MPH [facility] 02/07/2021, 11:53 AM

## 2021-02-05 LAB — AFP TUMOR MARKER: AFP-Tumor Marker: 2.3 ng/mL

## 2021-02-06 ENCOUNTER — Other Ambulatory Visit: Payer: Self-pay

## 2021-02-06 DIAGNOSIS — K831 Obstruction of bile duct: Secondary | ICD-10-CM

## 2021-02-07 ENCOUNTER — Ambulatory Visit (HOSPITAL_COMMUNITY)
Admission: RE | Admit: 2021-02-07 | Discharge: 2021-02-07 | Disposition: A | Discharge status: Home / Self Care | Payer: Medicare Other | Source: Ambulatory Visit | Attending: Gastroenterology | Admitting: Gastroenterology

## 2021-02-07 ENCOUNTER — Other Ambulatory Visit: Payer: Self-pay

## 2021-02-07 ENCOUNTER — Encounter: Payer: Self-pay | Admitting: Gastroenterology

## 2021-02-07 DIAGNOSIS — R748 Abnormal levels of other serum enzymes: Secondary | ICD-10-CM | POA: Diagnosis present

## 2021-02-07 DIAGNOSIS — K831 Obstruction of bile duct: Secondary | ICD-10-CM | POA: Diagnosis present

## 2021-02-08 ENCOUNTER — Other Ambulatory Visit: Payer: Self-pay

## 2021-02-08 DIAGNOSIS — K7460 Unspecified cirrhosis of liver: Secondary | ICD-10-CM

## 2021-02-19 ENCOUNTER — Other Ambulatory Visit: Payer: Self-pay | Admitting: Gastroenterology

## 2021-02-22 ENCOUNTER — Other Ambulatory Visit: Payer: Self-pay | Admitting: Gastroenterology

## 2021-02-22 ENCOUNTER — Other Ambulatory Visit: Payer: Self-pay

## 2021-02-22 ENCOUNTER — Ambulatory Visit (HOSPITAL_COMMUNITY)
Admission: RE | Admit: 2021-02-22 | Discharge: 2021-02-22 | Disposition: A | Discharge status: Home / Self Care | Payer: Medicare Other | Source: Ambulatory Visit | Attending: Gastroenterology | Admitting: Gastroenterology

## 2021-02-22 DIAGNOSIS — K7460 Unspecified cirrhosis of liver: Secondary | ICD-10-CM

## 2021-02-22 MED ORDER — GADOBUTROL 1 MMOL/ML IV SOLN
5.0000 mL | Freq: Once | INTRAVENOUS | Status: AC | PRN
  Administered 2021-02-22: 5 mL via INTRAVENOUS

## 2021-03-12 ENCOUNTER — Ambulatory Visit: Payer: Medicare Other | Admitting: Gastroenterology

## 2021-03-12 ENCOUNTER — Encounter: Payer: Self-pay | Admitting: Gastroenterology

## 2021-03-12 VITALS — BP 150/68 | HR 70 | Ht 62.0 in | Wt 127.0 lb

## 2021-03-12 DIAGNOSIS — K7469 Other cirrhosis of liver: Secondary | ICD-10-CM | POA: Diagnosis not present

## 2021-03-12 DIAGNOSIS — K766 Portal hypertension: Secondary | ICD-10-CM

## 2021-03-12 NOTE — Progress Notes (Signed)
Referring Provider: Willey Blade, MD Primary Care Physician:  Willey Blade, MD   Chief complaint:  Liver problem   IMPRESSION:  Small duct PSC with chronic cholestasis    - liver biopsy 11/2012: chronic biliary disease not classic for PBC    - AMA negative    - no history of IBD    - patient started Ursodiol, although not recommended by hepatologist    - associated metabolic bone disease Suspected cirrhosis by imaging and thrombocytopenia Abnormal liver enzymes due to liver disease Pancreatic divisum on MRCP Fatigue, likely related to chronic liver disease History of H pylori + - treated with triple therapy Normal colonoscopy with Dr. Collene Mares 2011 and in 2022  History of suspected small-duct PSC. Working to obtain prior serologies and biopsy results. Reviewed need for ADEK vitamin supplementation. Labs and imaging to assess for any advanced disease. Calcium and vitamin D supplements recommended. Aggressive treatment of metabolic bone disease. Immunizations and rationale in the setting of liver disease reviewed, although she declines.   Cirrhosis by labs and imaging: Labs and imaging every 6 months to monitor for disease progression and hepatocellular carcinoma. EGD to screen for varices.    PLAN: - CBC, PT/INR, AFP to evaluate synthetic function - Abdominal ultrasound 07/2021 - EGD to screen for varices - Treatment of metabolic bone disease per Dr. 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 She was previously followed by Dr. Halford Chessman with her last visit at [facility] in 2015.  Prior workup has included a liver biopsy in June of 2014 which showed chronic biliary disease not classic for PBC. She was found to be AMA negative, and has no history of IBD. An MRCP incidentally found pancreatic divisum and gallbladder sludge, but no abnormality of the biliary tree.  She reports being treated with 10 days of triple therapy for H. Pylor after a serology checked by her PCP returned positive.  Has severe fatigue. Denies jaundice, scleral icterus, or pruritis. Weight has been stable. Prior abdominal bloating that has improved with dietary changes. No other associated symptoms. No identified exacerbating or relieving features.   She continues taking Urosodiol 500 mg BID. Initially prescribed by Dr. Collene Mares and then Dr. Karlton Lemon. It was not recommended by Dr. Halford Chessman. She does not take any multivitamins except for vitamin D. Stopped taking them due to size.  Does not get the flu vaccine. Has completed Covid vaccine including the booster. Has not had the Pneumovax. She doesn't want to get vaccines. She avoids being around people.   Recent abdominal imaging: - MRCP 08/10/13: no findings of PSC, pancreas divisum, gallbladder sludge - Ultrasound 02/07/21: hepatic steatosis and nodular - MRI/MRCP 02/22/21: early changes of suspected cirrhosis  Lab review includes: - April 2015: Alk Phos 272, AST 49, ALT 86, T Bili 0.3. She has had labs earlier this year.  - 01/14/18 show a normal CMP except for alk phos 406, TB 0.7, AST 57, ALT 63, albumin 4.3. - 07/11/20: 3.8, hgb 10.8, platelets 132 - 01/07/21: TB 0.9, AST 48, ALT 35, alk phos 360, direct bilirubin 0.58, GGTP 516, LDH 192  Endoscopic history: - Colonoscopy 2011 with Dr. Collene Mares - reportedly normal - EGD 2011 with Dr. Collene Mares - reportedly normal - Colonoscopy 10/04/20: polypoid mucosa, no adenomas  Bone density testing 11/02/20: osteopenia/low bone mass  Past Medical History:  Diagnosis Date  Anemia    pt reports a while back   Discoid lupus    Hyperlipidemia    high level on meds to prevent getting worse   Hypertension    Liver disease    Other specified disorders of liver    In process of dx issues with liver/bile duct    Past Surgical History:  Procedure Laterality Date   COLONOSCOPY  2011-[facility]   LIVER BIOPSY       Current Outpatient Medications  Medication Sig Dispense Refill   amLODipine (NORVASC) 5 MG tablet Take 5 mg by mouth daily.     EPINEPHrine 0.3 mg/0.3 mL IJ SOAJ injection as needed.     fenofibrate 160 MG tablet Take 160 mg by mouth daily.     ursodiol (ACTIGALL) 500 MG tablet Take 500 mg by mouth 2 (two) times daily.     Vitamin D, Ergocalciferol, (DRISDOL) 50000 UNITS CAPS Take 50,000 Units by mouth every 7 (seven) days. Takes on (or around) each Friday.     Current Facility-Administered Medications  Medication Dose Route Frequency Provider Last Rate Last Admin   0.9 %  sodium chloride infusion  500 mL Intravenous Once Thornton Park, MD        Allergies as of 03/12/2021   (No Known Allergies)    Family History  Problem Relation Age of Onset   Heart disease Mother    Stroke Mother    Diabetes Father    Diabetes Brother    Diabetes Paternal Grandmother    Heart disease Maternal Grandmother    Colon cancer Paternal Aunt        63's at dx   Esophageal cancer Neg Hx    Rectal cancer Neg Hx    Stomach cancer Neg Hx    Colon polyps Neg Hx      Filed Weights   03/12/21 1115  Weight: 127 lb (57.6 kg)    Physical Exam: Vital signs were reviewed. General:   Alert, in NAD. No scleral icterus. No bilateral temporal wasting.  Heart:  Regular rate and rhythm; no murmurs Pulm: Clear anteriorly; no wheezing Abdomen:  Soft. Nontender. Nondistended. Normal bowel sounds. No rebound or guarding. No fluid wave. Liver edge is palpable two fingerbreaths below the costal margin in the midclavicular line. Spleen tip not palpable.  LAD: No inguinal or umbilical LAD Extremities:  Without edema. Neurologic:  Alert and  oriented x4;  grossly normal neurologically; no asterixis or clonus. Skin: No jaundice. No palmar erythema or spider angioma.  Psych:  Alert and cooperative. Normal mood and affect.   [name] L. Tarri Glenn, MD, MPH [facility] 03/18/2021, 8:01  PM

## 2021-03-12 NOTE — Patient Instructions (Addendum)
It was my pleasure to provide care to you today. Based on our discussion, I am providing you with my recommendations below:  RECOMMENDATION(S):   IMAGING:  You will be contacted by [facility] Scheduling (Your caller ID will indicate phone # [phone]) within the next business 7-10 business days to schedule your ABDOMINAL ULTRASOUND THAT IS DUE MID TO LATE NOVEMBER. If you have not heard from them within 7-10 business days, please call [facility] at [phone] to follow up on the status of your appointment.     LABS:   Please proceed to the basement level for lab work IN MARCH 2023 TO REPEAT YOUR LABS. Press "B" on the elevator. The lab is located at the first door on the left as you exit the elevator.  HEALTHCARE LAWS AND MY CHART RESULTS:   Due to recent changes in healthcare laws, you may see results of your imaging and/or laboratory studies on MyChart before I have had a chance to review them.  I understand that in some cases there may be results that are confusing or concerning to you. Please understand that not all results are received at same time and often I may need to interpret multiple results in order to provide you with the best plan of care or course of treatment. Therefore, I ask that you please give me 48 hours to thoroughly review all your results before contacting my office for clarification.   ENDOSCOPY:   You have been scheduled for an endoscopy. Please follow written instructions given to you at your visit today.  INHALERS:   If you use inhalers (even only as needed), please bring them with you on the day of your procedure.  FOLLOW UP:  After your procedure, you will receive a call from my office staff regarding my recommendation for follow up.  BMI:  If you are age 66 or older, your body mass index should be between 23-30. Your Body mass index is 23.23 kg/m. If this is out of the aforementioned range listed, please consider  follow up with your Primary Care Provider.  MY CHART:  The  GI providers would like to encourage you to use [facility] to communicate with providers for non-urgent requests or questions.  Due to long hold times on the telephone, sending your provider a message by [facility] may be a faster and more efficient way to get a response.  Please allow 48 business hours for a response.  Please remember that this is for non-urgent requests.   Thank you for trusting me with your gastrointestinal care!    Thornton Park, MD, MPH

## 2021-03-18 ENCOUNTER — Encounter: Payer: Self-pay | Admitting: Gastroenterology

## 2021-04-04 ENCOUNTER — Other Ambulatory Visit: Payer: Self-pay

## 2021-04-04 ENCOUNTER — Encounter: Payer: Self-pay | Admitting: Gastroenterology

## 2021-04-04 ENCOUNTER — Ambulatory Visit (AMBULATORY_SURGERY_CENTER): Payer: Medicare Other | Admitting: Gastroenterology

## 2021-04-04 VITALS — BP 120/84 | HR 66 | Temp 96.6°F | Resp 10 | Ht 62.0 in | Wt 127.0 lb

## 2021-04-04 DIAGNOSIS — K7469 Other cirrhosis of liver: Secondary | ICD-10-CM

## 2021-04-04 DIAGNOSIS — K831 Obstruction of bile duct: Secondary | ICD-10-CM

## 2021-04-04 DIAGNOSIS — K7460 Unspecified cirrhosis of liver: Secondary | ICD-10-CM

## 2021-04-04 DIAGNOSIS — K766 Portal hypertension: Secondary | ICD-10-CM

## 2021-04-04 MED ORDER — SODIUM CHLORIDE 0.9 % IV SOLN: 500.0000 mL | Freq: Once | INTRAVENOUS | Status: DC

## 2021-04-04 NOTE — Patient Instructions (Signed)
YOU HAD AN ENDOSCOPIC PROCEDURE TODAY AT THE [facility]:   Refer to the procedure report that was given to you for any specific questions about what was found during the examination.  If the procedure report does not answer your questions, please call your gastroenterologist to clarify.  If you requested that your care partner not be given the details of your procedure findings, then the procedure report has been included in a sealed envelope for you to review at your convenience later.  YOU SHOULD EXPECT: Some feelings of bloating in the abdomen. Passage of more gas than usual.  Walking can help get rid of the air that was put into your GI tract during the procedure and reduce the bloating. If you had a lower endoscopy (such as a colonoscopy or flexible sigmoidoscopy) you may notice spotting of blood in your stool or on the toilet paper. If you underwent a bowel prep for your procedure, you may not have a normal bowel movement for a few days.  Please Note:  You might notice some irritation and congestion in your nose or some drainage.  This is from the oxygen used during your procedure.  There is no need for concern and it should clear up in a day or so.  SYMPTOMS TO REPORT IMMEDIATELY:    Following upper endoscopy (EGD)  Vomiting of blood or coffee ground material  New chest pain or pain under the shoulder blades  Painful or persistently difficult swallowing  New shortness of breath  Fever of 100F or higher  Black, tarry-looking stools  For urgent or emergent issues, a gastroenterologist can be reached at any hour by calling [phone]. Do not use MyChart messaging for urgent concerns.    DIET:  We do recommend a small meal at first, but then you may proceed to your regular diet.  Drink plenty of fluids but you should avoid alcoholic beverages for 24 hours.  ACTIVITY:  You should plan to take it easy for the rest of today and you should NOT DRIVE or use heavy machinery  until tomorrow (because of the sedation medicines used during the test).    FOLLOW UP: Our staff will call the number listed on your records 48-72 hours following your procedure to check on you and address any questions or concerns that you may have regarding the information given to you following your procedure. If we do not reach you, we will leave a message.  We will attempt to reach you two times.  During this call, we will ask if you have developed any symptoms of COVID 19. If you develop any symptoms (ie: fever, flu-like symptoms, shortness of breath, cough etc.) before then, please call [phone].  If you test positive for Covid 19 in the 2 weeks post procedure, please call and report this information to us.    If any biopsies were taken you will be contacted by phone or by letter within the next 1-3 weeks.  Please call us at [phone] if you have not heard about the biopsies in 3 weeks.    SIGNATURES/CONFIDENTIALITY: You and/or your care partner have signed paperwork which will be entered into your electronic medical record.  These signatures attest to the fact that that the information above on your After Visit Summary has been reviewed and is understood.  Full responsibility of the confidentiality of this discharge information lies with you and/or your care-partner. 

## 2021-04-04 NOTE — Progress Notes (Signed)
DT-VS Pt's states no medical or surgical changes since previsit or office visit.

## 2021-04-04 NOTE — Op Note (Signed)
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 The patient's tolerance of                            previous anesthesia was also reviewed. The risks                            and benefits of the procedure and the sedation                            options and risks were discussed with the patient.                            All questions were answered, and informed consent                            was obtained. Prior Anticoagulants: The patient has                            taken no previous anticoagulant or antiplatelet                            agents. ASA Grade Assessment: III - A patient with                            severe systemic disease. After reviewing the risks                            and benefits, the patient was deemed in  satisfactory condition to undergo the procedure.                           After obtaining informed consent, the endoscope was                            passed under direct vision. Throughout the                            procedure, the patient's blood pressure, pulse, and                            oxygen saturations were monitored continuously. The                            GIF [id number] #[id number] was introduced through the                             mouth, and advanced to the third part of duodenum.                            The upper GI endoscopy was accomplished without                            difficulty. The patient tolerated the procedure                            well. Scope In: Scope Out: Findings:                 The examined esophagus was normal except for one                            prominent vein in the lower esophagus. No varices.                           The entire examined stomach was normal. No gastric                            varices or portal hypertensive gastropathy.                           The examined duodenum was normal.                           The cardia and gastric fundus were normal on                            retroflexion. Complications:            No immediate complications. Estimated Blood Loss:     Estimated blood loss: none. Impression:               - Normal exam. No endoscopic findings of portal  hypertension. Recommendation:           - Patient has a contact number available for                            emergencies. The signs and symptoms of potential                            delayed complications were discussed with the                            patient. Return to normal activities tomorrow.                            Written discharge instructions were provided to the                            patient.                           - Resume previous diet.                           - Continue present medications.                           - Repeat upper endoscopy in 2 years for                            surveillance.                           - Return to GI office as previously scheduled. Thornton Park MD, MD 04/04/2021 10:43:48 AM This report has been signed electronically.

## 2021-04-04 NOTE — Progress Notes (Signed)
Referring Provider: Willey Blade, MD Primary Care Physician:  Willey Blade, MD   Indication for Procedure:  Cirrhosis, portal hypertension   IMPRESSION:  Suspected cirrhosis by imaging and thrombocytopenia Small duct PSC with chronic cholestasis    - liver biopsy 11/2012: chronic biliary disease not classic for PBC    - AMA negative    - no history of IBD    - patient started Ursodiol, although not recommended by hepatologist    - associated metabolic bone disease Abnormal liver enzymes due to liver disease Pancreatic divisum on MRCP Fatigue, likely related to chronic liver disease History of H pylori + - treated with triple therapy Normal colonoscopy with Dr. Collene Mares 2011 and in 2022 Appropriate candidate for monitored anesthesia care in the [place]  Cirrhosis by labs and imaging. EGD to screen for varices.    PLAN: EGD to screen for varices     HPI: Valerie Rojas is a 65 y.o. who presents for EGD to screen for varices in the setting of cirrhosis and portal hypertension. Please see my 03/12/21 office note for complete details. There has been no change in history or physical exam.   Past Medical History:  Diagnosis Date   Anemia    pt reports a while back   Discoid lupus    Hyperlipidemia    high level on meds to prevent getting worse   Hypertension    Liver disease    Other specified disorders of liver    In process of dx issues with liver/bile duct    Past Surgical History:  Procedure Laterality Date   COLONOSCOPY  2011-[facility]   LIVER BIOPSY      Current Outpatient Medications  Medication Sig Dispense Refill   amLODipine (NORVASC) 5 MG tablet Take 5 mg by mouth daily.     EPINEPHrine 0.3 mg/0.3 mL IJ SOAJ injection as needed.     fenofibrate 160 MG tablet Take 160 mg by mouth daily.     ursodiol (ACTIGALL) 500 MG tablet Take 500 mg by mouth 2 (two) times daily.     Vitamin D, Ergocalciferol, (DRISDOL) 50000 UNITS CAPS Take 50,000 Units by mouth every 7  (seven) days. Takes on (or around) each Friday. (Patient not taking: Reported on 04/04/2021)     Current Facility-Administered Medications  Medication Dose Route Frequency Provider Last Rate Last Admin   0.9 %  sodium chloride infusion  500 mL Intravenous Once Thornton Park, MD       0.9 %  sodium chloride infusion  500 mL Intravenous Once Thornton Park, MD        Allergies as of 04/04/2021   (No Known Allergies)    Family History  Problem Relation Age of Onset   Heart disease Mother    Stroke Mother    Diabetes Father    Diabetes Brother    Diabetes Paternal Grandmother    Heart disease Maternal Grandmother    Colon cancer Paternal Aunt        50's at dx   Esophageal cancer Neg Hx    Rectal cancer Neg Hx    Stomach cancer Neg Hx    Colon polyps Neg Hx      Filed Weights   04/04/21 0941  Weight: 127 lb (57.6 kg)    Physical Exam: Vital signs were reviewed. General:   Alert, in NAD. No scleral icterus. No bilateral temporal wasting.  Heart:  Regular rate and rhythm; no murmurs Pulm: Clear anteriorly; no wheezing Abdomen:  Soft.  Nontender. Nondistended. Normal bowel sounds. No rebound or guarding. No fluid wave. Liver edge is palpable two fingerbreaths below the costal margin in the midclavicular line. Spleen tip not palpable.  LAD: No inguinal or umbilical LAD Extremities:  Without edema. Neurologic:  Alert and  oriented x4;  grossly normal neurologically; no asterixis or clonus. Skin: No jaundice. No palmar erythema or spider angioma.  Psych:  Alert and cooperative. Normal mood and affect.   Jaydan Meidinger L. Tarri Glenn, MD, MPH [facility] 04/04/2021, 10:24 AM

## 2021-04-04 NOTE — Progress Notes (Signed)
Pt in recovery with monitors in place, VSS. Report given to receiving RN. Bite guard was placed with pt awake to ensure comfort. No dental or soft tissue damage noted. 

## 2021-04-08 ENCOUNTER — Telehealth: Payer: Self-pay | Admitting: *Deleted

## 2021-04-08 NOTE — Telephone Encounter (Signed)
  Follow up Call-  Call back number 04/04/2021 10/04/2020  Post procedure Call Back phone  # [phone]  Permission to leave phone message Yes Yes  Some recent data might be hidden     Patient questions:  Do you have a fever, pain , or abdominal swelling? No. Pain Score  0 *  Have you tolerated food without any problems? Yes.    Have you been able to return to your normal activities? Yes.    Do you have any questions about your discharge instructions: Diet   No. Medications  No. Follow up visit  No.  Do you have questions or concerns about your Care? No.  Actions: * If pain score is 4 or above: No action needed, pain <4.  Have you developed a fever since your procedure? no  2.   Have you had an respiratory symptoms (SOB or cough) since your procedure? no  3.   Have you tested positive for COVID 19 since your procedure no  4.   Have you had any family members/close contacts diagnosed with the COVID 19 since your procedure?  no   If yes to any of these questions please route to Joylene John, RN and Joella Prince, RN

## 2021-04-10 IMAGING — MG MM DIGITAL SCREENING BILAT W/ TOMO AND CAD
6 of 10 series · 6 of 30 positions shown · non-contrast
Comparison: Previous exam(s).

CLINICAL DATA: Screening.

EXAM:
DIGITAL SCREENING BILATERAL MAMMOGRAM WITH TOMOSYNTHESIS AND CAD
TECHNIQUE: Bilateral screening digital craniocaudal and mediolateral oblique
mammograms were obtained. Bilateral screening digital breast
tomosynthesis was performed. The images were evaluated with
computer-aided detection.

[R CC synth-2D]
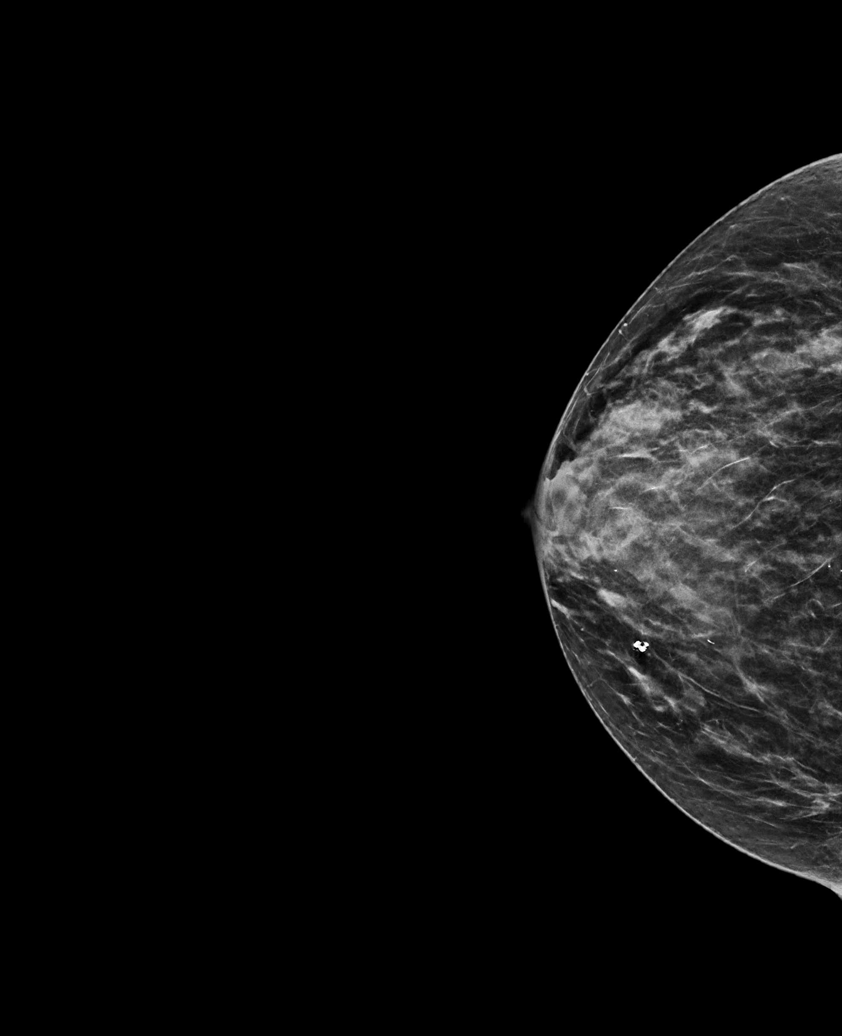

[L MLO synth-2D]
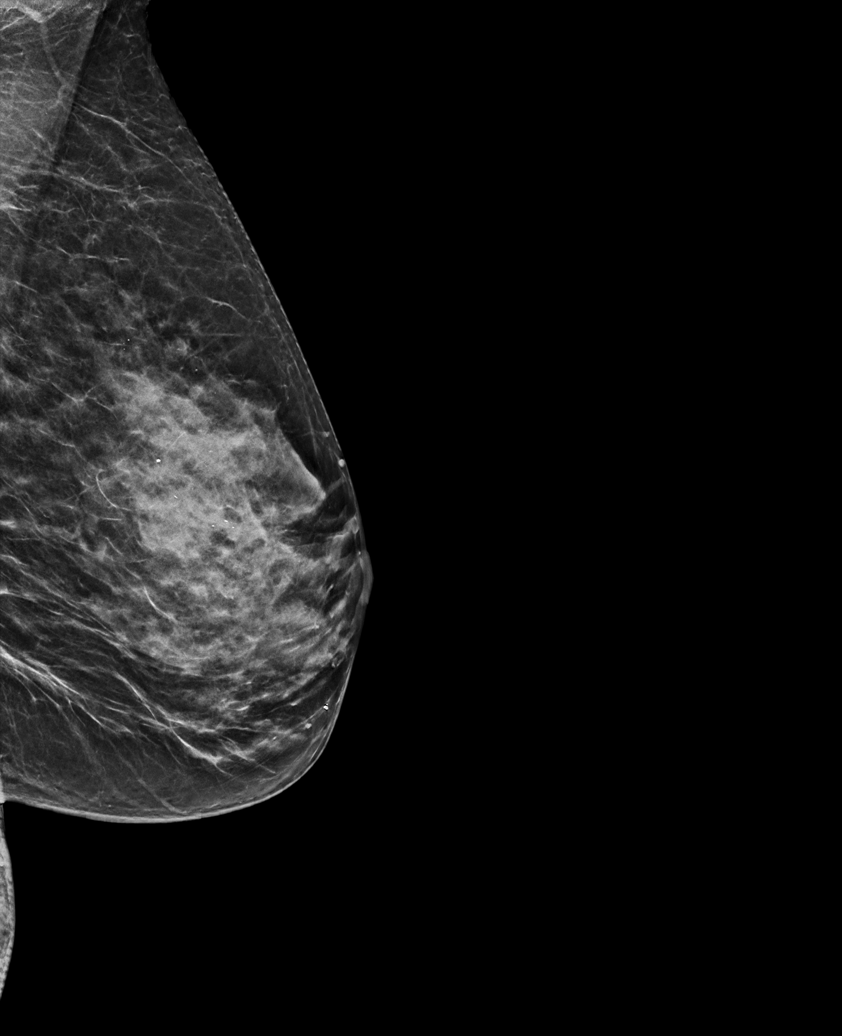

[R MLO synth-2D (1 of 2)]
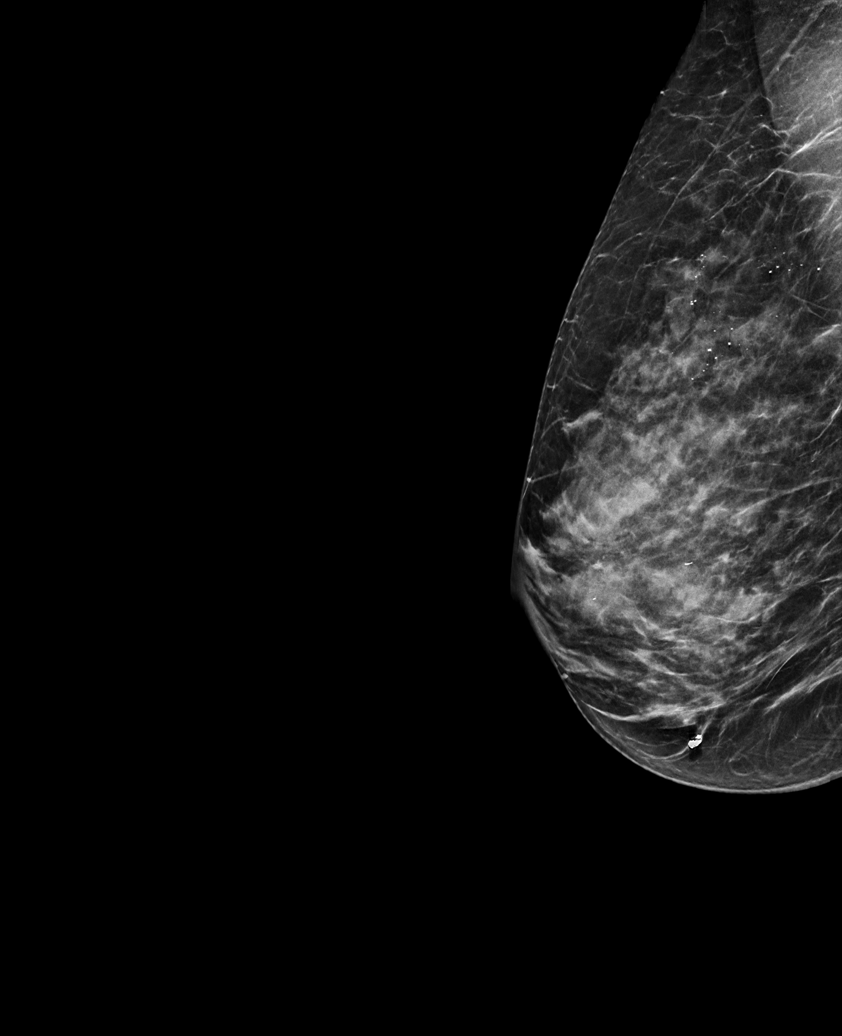

[L CC synth-2D]
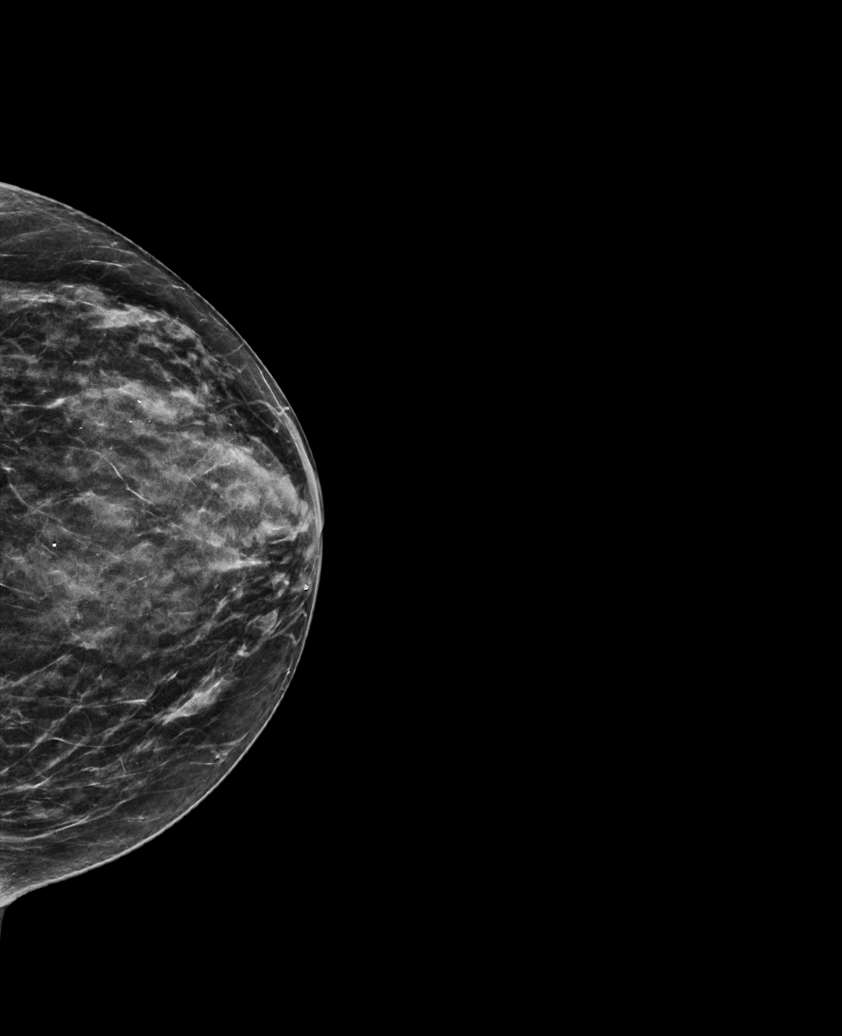

[R MLO synth-2D (2 of 2)]
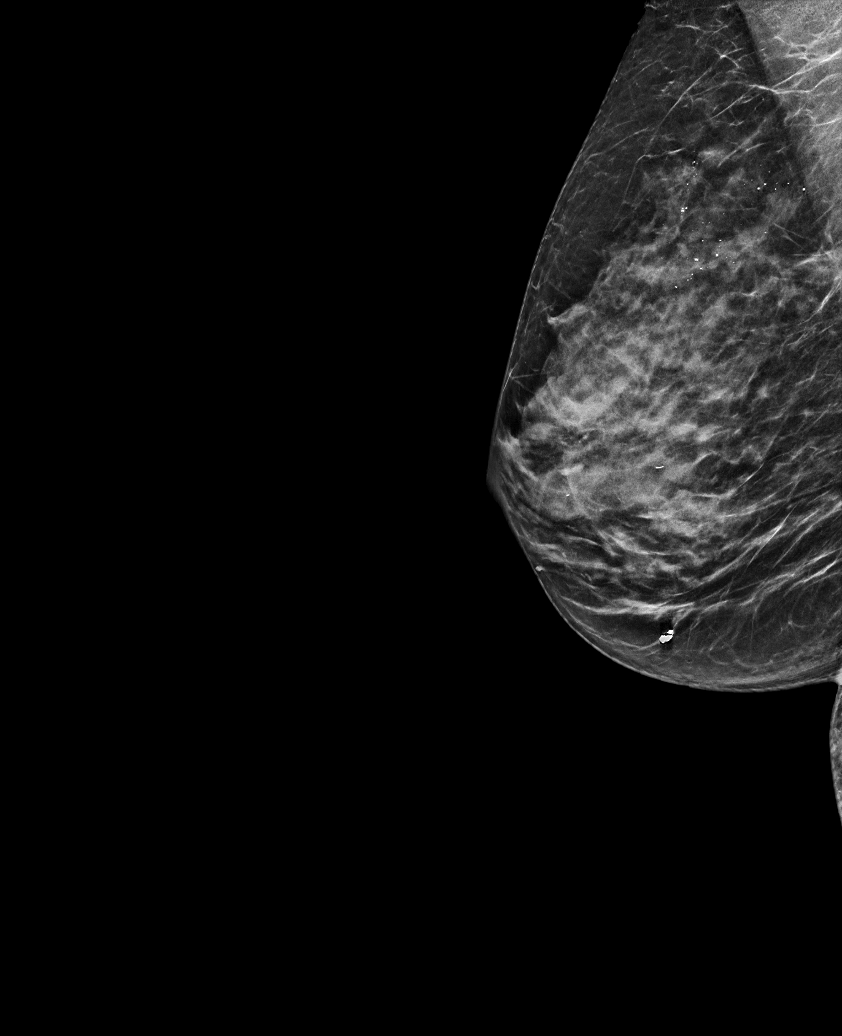

[R MLO tomo · tomo slice 27/54.0]
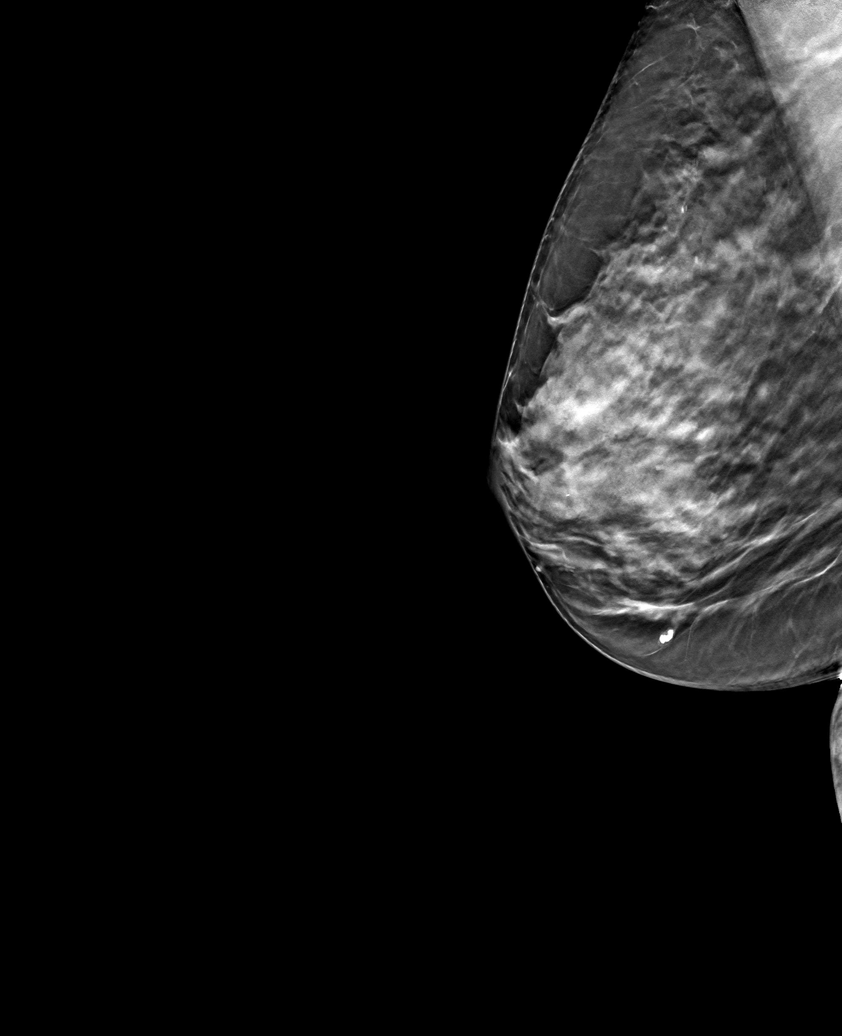

[6 of 30 positions shown; findings below may reference images not displayed]

ACR Breast Density Category c: The breast tissue is heterogeneously
dense, which may obscure small masses.
FINDINGS: There are no findings suspicious for malignancy. The images were
evaluated with computer-aided detection.
IMPRESSION: No mammographic evidence of malignancy. A result letter of this
screening mammogram will be mailed directly to the patient.

RECOMMENDATION:
Screening mammogram in one year. (Code:T4-5-GWO)

BI-RADS CATEGORY  1: Negative.

## 2021-06-17 DIAGNOSIS — I10 Essential (primary) hypertension: Secondary | ICD-10-CM | POA: Insufficient documentation

## 2021-06-17 DIAGNOSIS — M329 Systemic lupus erythematosus, unspecified: Secondary | ICD-10-CM | POA: Insufficient documentation

## 2021-06-18 LAB — HM HEPATITIS C SCREENING LAB: HM Hepatitis Screen: NEGATIVE

## 2022-01-02 ENCOUNTER — Other Ambulatory Visit: Payer: Self-pay | Admitting: Internal Medicine

## 2022-01-02 ENCOUNTER — Ambulatory Visit
Admission: RE | Admit: 2022-01-02 | Discharge: 2022-01-02 | Disposition: A | Discharge status: Home / Self Care | Payer: Medicare Other | Source: Ambulatory Visit | Attending: Internal Medicine | Admitting: Internal Medicine

## 2022-01-02 DIAGNOSIS — R0789 Other chest pain: Secondary | ICD-10-CM

## 2022-04-28 ENCOUNTER — Encounter: Payer: Self-pay | Admitting: Gastroenterology

## 2022-04-28 ENCOUNTER — Ambulatory Visit: Payer: Medicare Other | Admitting: Gastroenterology

## 2022-04-28 ENCOUNTER — Other Ambulatory Visit (INDEPENDENT_AMBULATORY_CARE_PROVIDER_SITE_OTHER): Payer: Medicare Other

## 2022-04-28 VITALS — BP 144/66 | HR 88 | Ht 62.0 in | Wt 127.0 lb

## 2022-04-28 DIAGNOSIS — R748 Abnormal levels of other serum enzymes: Secondary | ICD-10-CM | POA: Diagnosis not present

## 2022-04-28 DIAGNOSIS — K7460 Unspecified cirrhosis of liver: Secondary | ICD-10-CM

## 2022-04-28 DIAGNOSIS — K831 Obstruction of bile duct: Secondary | ICD-10-CM | POA: Diagnosis not present

## 2022-04-28 DIAGNOSIS — K7469 Other cirrhosis of liver: Secondary | ICD-10-CM

## 2022-04-28 LAB — CBC WITH DIFFERENTIAL/PLATELET
Basophils Absolute: 0 10*3/uL (ref 0.0–0.1)
Basophils Relative: 0.6 % (ref 0.0–3.0)
Eosinophils Absolute: 0 10*3/uL (ref 0.0–0.7)
Eosinophils Relative: 1 % (ref 0.0–5.0)
HCT: 31.3 % — ABNORMAL LOW (ref 36.0–46.0)
Hemoglobin: 10 g/dL — ABNORMAL LOW (ref 12.0–15.0)
Lymphocytes Relative: 41.8 % (ref 12.0–46.0)
Lymphs Abs: 1.8 10*3/uL (ref 0.7–4.0)
MCHC: 31.8 g/dL (ref 30.0–36.0)
MCV: 70.1 fl — ABNORMAL LOW (ref 78.0–100.0)
Monocytes Absolute: 0.3 10*3/uL (ref 0.1–1.0)
Monocytes Relative: 7.9 % (ref 3.0–12.0)
Neutro Abs: 2.1 10*3/uL (ref 1.4–7.7)
Neutrophils Relative %: 48.7 % (ref 43.0–77.0)
Platelets: 145 10*3/uL — ABNORMAL LOW (ref 150.0–400.0)
RBC: 4.47 Mil/uL (ref 3.87–5.11)
RDW: 15.3 % (ref 11.5–15.5)
WBC: 4.4 10*3/uL (ref 4.0–10.5)

## 2022-04-28 LAB — COMPREHENSIVE METABOLIC PANEL
ALT: 45 U/L — ABNORMAL HIGH (ref 0–35)
AST: 48 U/L — ABNORMAL HIGH (ref 0–37)
Albumin: 4.1 g/dL (ref 3.5–5.2)
Alkaline Phosphatase: 379 U/L — ABNORMAL HIGH (ref 39–117)
BUN: 15 mg/dL (ref 6–23)
CO2: 26 mEq/L (ref 19–32)
Calcium: 9.7 mg/dL (ref 8.4–10.5)
Chloride: 103 mEq/L (ref 96–112)
Creatinine, Ser: 0.64 mg/dL (ref 0.40–1.20)
GFR: 91.77 mL/min (ref >60.00)
Glucose, Bld: 95 mg/dL (ref 70–99)
Potassium: 4 mEq/L (ref 3.5–5.1)
Sodium: 138 mEq/L (ref 135–145)
Total Bilirubin: 0.6 mg/dL (ref 0.2–1.2)
Total Protein: 8 g/dL (ref 6.0–8.3)

## 2022-04-28 LAB — PROTIME-INR
INR: 1.1 ratio — ABNORMAL HIGH (ref 0.8–1.0)
Prothrombin Time: 11.8 s (ref 9.6–13.1)

## 2022-04-28 MED ORDER — OCALIVA 5 MG PO TABS: 5.0000 mg | ORAL_TABLET | Freq: Every day | ORAL | 3 refills | Status: DC

## 2022-04-28 NOTE — Progress Notes (Signed)
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 Collene Mares 2011 and in 2022     PLAN: - Continue Ursodiol - Will start Ocaliva 5 mg PO qDay if approved by insurance - CBC, CMP, PT/INR, AFP - Abdominal ultrasound 05/2022 and again in 6 months - Treatment of metabolic bone disease per Dr. Karlton Lemon - 2gram sodium restricted diet - Declines influenza vaccine - Return to the office in 6 months, earlier if needed - I have encouraged her to follow-up at [facility] given her lack of response to Ursodiol and cost-prohibitive nature of Ocaliva. Given her advanced fibrosis, aggressive treatment is recommended.  - See patient instructions for additional lifestyle recommendations.    HPI: Valerie Rojas is a 66 y.o. 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 She was last seen in the office 02/04/21 and had endoscopy 04/04/21. In the interim, she has been receiving care in the [facility]. She has also been seeing a chiropractor on her path to self care. Returns today wishing to reestablish care here. Please see prior notes for full details about her history. The interval history is obtained through the patient and review of CareEverywhere.   She has ongoing fatigue despite Ursodiol 500 mg BID.  Seen at [facility] in 08/2021 and 11/2021. They recommended Alvino Blood but it was too expensive. She feels like  the [facility] has helped over the years. They recommended that she return in 3 months but she felt she was only there for a second opinion. They encouraged her to follow-up with the [facility] clinical trials group.  No other symptoms of decompensation.   Recent abdominal imaging: - MRCP 08/10/13: no findings of PSC, pancreas divisum, gallbladder sludge - Ultrasound 02/07/21: hepatic steatosis and nodular - MRI/MRCP 02/22/21: early changes of suspected cirrhosis - MRI/MRCP 12/19/21: No liver lesion, no PSC  LIVER BIOPSY 06/2021 Liver with moderate portal inflammation with bile duct loss and ductular proliferation.   Lab review includes: - April 2015: Alk Phos 272, AST 49, ALT 86, T Bili 0.3. She has had labs earlier this year.  - 01/14/18 show a normal CMP except for alk phos 406, TB 0.7, AST 57, ALT 63, albumin 4.3. - 07/11/20: 3.8, hgb 10.8, platelets 132 - 01/07/21: TB 0.9, AST 48, ALT 35, alk phos 360, direct bilirubin 0.58, GGTP 516, LDH 192  Endoscopic history: - Colonoscopy 2011 with Dr. Collene Mares - reportedly normal - EGD 2011 with Dr. Collene Mares - reportedly normal - Colonoscopy 10/04/20: polypoid mucosa, no adenomas  Bone density testing 11/02/20: osteopenia/low bone mass  Past Medical History:  Diagnosis Date   Anemia    pt reports a while back   Discoid lupus    Hyperlipidemia    high level on meds to prevent getting worse   Hypertension    Liver disease    Other specified disorders of liver    In process of dx issues with liver/bile duct  Past Surgical History:  Procedure Laterality Date   COLONOSCOPY  2011-[facility]   LIVER BIOPSY      Current Outpatient Medications  Medication Sig Dispense Refill   amLODipine (NORVASC) 5 MG tablet Take 5 mg by mouth daily.     Cholecalciferol 125 MCG (5000 UT) TABS Take 1 tablet by mouth daily.     EPINEPHrine 0.3 mg/0.3 mL IJ SOAJ injection as needed.     Obeticholic Acid (OCALIVA) 5 MG TABS Take 5 mg by mouth daily. 30 tablet 3   ursodiol (ACTIGALL)  500 MG tablet Take 500 mg by mouth 2 (two) times daily.     Current Facility-Administered Medications  Medication Dose Route Frequency Provider Last Rate Last Admin   0.9 %  sodium chloride infusion  500 mL Intravenous Once Thornton Park, MD        Allergies as of 04/28/2022   (No Known Allergies)    Family History  Problem Relation Age of Onset   Heart disease Mother    Stroke Mother    Diabetes Father    Diabetes Brother    Colon cancer Paternal Aunt        43's at dx   Heart disease Maternal Grandmother    Diabetes Paternal Grandmother    Esophageal cancer Neg Hx    Rectal cancer Neg Hx    Stomach cancer Neg Hx    Colon polyps Neg Hx    Pancreatic cancer Neg Hx      Filed Weights   04/28/22 1412  Weight: 127 lb (57.6 kg)    Physical Exam: Vital signs were reviewed. General:   Alert, in NAD. No scleral icterus. No bilateral temporal wasting.  Heart:  Regular rate and rhythm; no murmurs Pulm: Clear anteriorly; no wheezing Abdomen:  Soft. Thin. Nontender. Nondistended. Normal bowel sounds. No rebound or guarding. No fluid wave. Liver edge is palpable two fingerbreaths below the costal margin in the midclavicular line. Spleen tip not palpable.  LAD: No inguinal or umbilical LAD Extremities:  Without edema. Neurologic:  Alert and  oriented x4;  grossly normal neurologically; no asterixis or clonus. Skin: No jaundice. No palmar erythema or spider angioma.  Psych:  Alert and cooperative. Normal mood and affect.   Keldon Lassen L. Tarri Glenn, MD, MPH [facility] 05/11/2022, 8:39 PM

## 2022-04-28 NOTE — Patient Instructions (Addendum)
I would encourage you to reach out to the [facility] trials group.  We discussed labs today to monitor your liver. Please stop in the basement on your way out.   Continue Urosdiol. Ideally, we could add obetacholic acid to help improve your liver enzymes.  Avoid of raw shellfish for cirrhotic patients  Two to three 8 oz cups of regular coffee each day provides some liver protective effect. This slows progression of liver disease, decompensation, and lowers risk of development of Liver Cancer.   I recommend screening for liver cancer with ultrasound, CT or MRI every 6 months  I recommend maintaining a healthy weight  I recommend 30 minutes of regular exercise five days per week is recommended.  Returns at least every 6 months, earlier with new symptoms.

## 2022-04-29 LAB — AFP TUMOR MARKER: AFP-Tumor Marker: 2.3 ng/mL

## 2022-05-11 ENCOUNTER — Encounter: Payer: Self-pay | Admitting: Gastroenterology

## 2022-05-19 ENCOUNTER — Encounter: Payer: Self-pay | Admitting: Gastroenterology

## 2022-05-19 NOTE — Progress Notes (Signed)
Reviewed. Also seen at [facility] 04/30/22 where these results were also reviewed. Plans to follow-up at [facility] in 6 months. They are working to getting [place] approved.

## 2023-07-22 ENCOUNTER — Other Ambulatory Visit (HOSPITAL_COMMUNITY): Payer: Self-pay | Admitting: Internal Medicine

## 2023-07-22 DIAGNOSIS — E785 Hyperlipidemia, unspecified: Secondary | ICD-10-CM

## 2023-08-05 ENCOUNTER — Ambulatory Visit
Admission: RE | Admit: 2023-08-05 | Discharge: 2023-08-05 | Disposition: A | Discharge status: Home / Self Care | Payer: Self-pay | Source: Ambulatory Visit | Attending: Internal Medicine | Admitting: Internal Medicine

## 2023-08-05 DIAGNOSIS — E785 Hyperlipidemia, unspecified: Secondary | ICD-10-CM | POA: Insufficient documentation

## 2023-11-27 LAB — HM DEXA SCAN

## 2024-02-01 ENCOUNTER — Ambulatory Visit
Admission: RE | Admit: 2024-02-01 | Discharge: 2024-02-01 | Disposition: A | Discharge status: Home / Self Care | Payer: Self-pay

## 2024-02-01 ENCOUNTER — Ambulatory Visit: Payer: Self-pay

## 2024-02-01 VITALS — BP 147/77 | HR 81 | Temp 98.2°F | Resp 18

## 2024-02-01 DIAGNOSIS — L02212 Cutaneous abscess of back [any part, except buttock]: Secondary | ICD-10-CM | POA: Diagnosis not present

## 2024-02-01 MED ORDER — DOXYCYCLINE HYCLATE 100 MG PO CAPS: 100.0000 mg | ORAL_CAPSULE | Freq: Two times a day (BID) | ORAL | 0 refills | Status: AC

## 2024-02-01 NOTE — ED Provider Notes (Signed)
 [name]    CSN: [id number] Arrival date & time: 02/01/24  1558      History   Chief Complaint Chief Complaint  Patient presents with   Abscess    Abscess located on back along spine needs draining; has enlarged and is painful. - Entered by patient    HPI Valerie Rojas is a 68 y.o. female.  Patient presents with a painful swollen abscess in the middle of her back which has been there for a while but is worse in the last week.  It has become bigger and more painful.  She reports possible scant drainage.  No fever.  She is unable to see her dermatologist until September.  Last tetanus approximately 3 years ago per patient. The history is provided by the patient and medical records.    Past Medical History:  Diagnosis Date   Anemia    pt reports a while back   Discoid lupus    Hyperlipidemia    high level on meds to prevent getting worse   Hypertension    Liver disease    Other specified disorders of liver    In process of dx issues with liver/bile duct    Patient Active Problem List   Diagnosis Date Noted   Elevated liver enzymes 04/03/2013   Anemia 04/03/2013   GERD 09/02/2007   CHEST PAIN UNSPECIFIED 09/02/2007    Past Surgical History:  Procedure Laterality Date   COLONOSCOPY  2011-[facility]   LIVER BIOPSY      OB History   No obstetric history on file.      Home Medications    Prior to Admission medications   Medication Sig Start Date End Date Taking? Authorizing Provider  amLODipine (NORVASC) 5 MG tablet Take 5 mg by mouth daily.   Yes [provider]  carvedilol (COREG) 3.125 MG tablet Take 3.125 mg by mouth. 12/07/23 12/06/24 Yes [provider]  doxycycline  (VIBRAMYCIN ) 100 MG capsule Take 1 capsule (100 mg total) by mouth 2 (two) times daily for 7 days. 02/01/24 02/08/24 Yes Corlis Burnard VEAR, NP  vitamin B-12 (CYANOCOBALAMIN) 100 MCG tablet Take 100 mcg by mouth daily.   Yes [provider]  Cholecalciferol 125 MCG (5000  UT) TABS Take 1 tablet by mouth daily.    [provider]  EPINEPHrine 0.3 mg/0.3 mL IJ SOAJ injection as needed.    [provider]  LIVDELZI 10 MG CAPS Take 1 capsule by mouth daily.    [provider]  Obeticholic Acid  (OCALIVA ) 5 MG TABS Take 5 mg by mouth daily. Patient not taking: Reported on 02/01/2024 04/28/22   Eda Iha, MD  ursodiol (ACTIGALL) 500 MG tablet Take 500 mg by mouth 2 (two) times daily. 02/03/21   [provider]    Family History Family History  Problem Relation Age of Onset   Heart disease Mother    Stroke Mother    Diabetes Father    Diabetes Brother    Colon cancer Paternal Aunt        60's at dx   Heart disease Maternal Grandmother    Diabetes Paternal Grandmother    Esophageal cancer Neg Hx    Rectal cancer Neg Hx    Stomach cancer Neg Hx    Colon polyps Neg Hx    Pancreatic cancer Neg Hx     Social History Social History   Tobacco Use   Smoking status: Never   Smokeless tobacco: Never  Vaping Use  Vaping status: Never Used  Substance Use Topics   Alcohol use: No   Drug use: No     Allergies   Patient has no known allergies.   Review of Systems Review of Systems  Constitutional:  Negative for chills and fever.  Skin:  Positive for color change and wound.  Neurological:  Negative for weakness and numbness.     Physical Exam Triage Vital Signs ED Triage Vitals  Encounter Vitals Group     BP      Girls Systolic BP Percentile      Girls Diastolic BP Percentile      Boys Systolic BP Percentile      Boys Diastolic BP Percentile      Pulse      Resp      Temp      Temp src      SpO2      Weight      Height      Head Circumference      Peak Flow      Pain Score      Pain Loc      Pain Education      Exclude from Growth Chart    No data found.  Updated Vital Signs BP (!) 147/77   Pulse 81   Temp 98.2 F (36.8 C)   Resp 18   SpO2 99%   Visual Acuity Right Eye Distance:    Left Eye Distance:   Bilateral Distance:    Right Eye Near:   Left Eye Near:    Bilateral Near:     Physical Exam Constitutional:      General: She is not in acute distress. HENT:     Mouth/Throat:     Mouth: Mucous membranes are moist.  Cardiovascular:     Rate and Rhythm: Normal rate and regular rhythm.  Pulmonary:     Effort: Pulmonary effort is normal. No respiratory distress.  Skin:    General: Skin is warm and dry.     Findings: Lesion present.     Comments: Mid-back: approximately 4 cm x 3 cm tender fluctuant area with three small pustules in center; no drainage.   Neurological:     General: No focal deficit present.     Mental Status: She is alert.     Sensory: No sensory deficit.     Motor: No weakness.      UC Treatments / Results  Labs (all labs ordered are listed, but only abnormal results are displayed) Labs Reviewed - No data to display  EKG   Radiology No results found.  Procedures Incision and Drainage  Date/Time: 02/01/2024 4:44 PM  Performed by: Corlis Burnard DEL, NP Authorized by: Corlis Burnard DEL, NP   Consent:    Consent obtained:  Verbal   Consent given by:  Patient   Risks discussed:  Bleeding, incomplete drainage and pain Universal protocol:    Procedure explained and questions answered to patient or proxy's satisfaction: yes   Location:    Type:  Abscess   Location:  Trunk   Trunk location:  Back Pre-procedure details:    Skin preparation:  Povidone-iodine Anesthesia:    Anesthesia method:  Local infiltration   Local anesthetic:  Lidocaine 1% w/o epi Procedure type:    Complexity:  Simple Procedure details:    Incision types:  Single straight   Drainage:  Purulent   Drainage amount:  Moderate   Wound treatment:  Wound left open  Packing materials:  None Post-procedure details:    Procedure completion:  Tolerated well, no immediate complications  (including critical care time)  Medications Ordered in UC Medications - No  data to display  Initial Impression / Assessment and Plan / UC Course  I have reviewed the triage vital signs and the nursing notes.  Pertinent labs & imaging results that were available during my care of the patient were reviewed by me and considered in my medical decision making (see chart for details).    Abscess of mid back.  I&D performed with return of moderate amount of purulent drainage.  Treating today with doxycycline .  Wound care instructions discussed.  Instructed patient to follow-up with her PCP or her dermatologist.  Education provided on skin abscess.  She agrees to plan of care.  Final Clinical Impressions(s) / UC Diagnoses   Final diagnoses:  Abscess of back     Discharge Instructions      Take the doxycycline  as directed.  Follow up with your primary care provider or dermatologist.        ED Prescriptions     Medication Sig Dispense Auth. Provider   doxycycline  (VIBRAMYCIN ) 100 MG capsule Take 1 capsule (100 mg total) by mouth 2 (two) times daily for 7 days. 14 capsule Corlis Burnard DEL, NP      PDMP not reviewed this encounter.   Corlis Burnard DEL, NP 02/01/24 [phone]

## 2024-02-01 NOTE — Discharge Instructions (Addendum)
 Take the doxycycline  as directed.  Follow up with your primary care provider or dermatologist.

## 2024-02-01 NOTE — ED Triage Notes (Addendum)
 Patient to Urgent Care with complaints of an abscess to the middle of her back. Has noticed some drainage.   Symptoms worse x1 week. Reports the area has been there for a while. Unable to see derm until September.   TDAP 07/11/21

## 2024-02-04 ENCOUNTER — Ambulatory Visit: Payer: Self-pay

## 2024-02-05 ENCOUNTER — Ambulatory Visit: Payer: Self-pay

## 2024-02-27 ENCOUNTER — Ambulatory Visit
Admission: RE | Admit: 2024-02-27 | Discharge: 2024-02-27 | Disposition: A | Discharge status: Home / Self Care | Payer: Self-pay | Source: Ambulatory Visit | Attending: Internal Medicine | Admitting: Internal Medicine

## 2024-02-27 VITALS — BP 134/85 | HR 67 | Temp 97.9°F | Resp 18

## 2024-02-27 DIAGNOSIS — M7989 Other specified soft tissue disorders: Secondary | ICD-10-CM | POA: Diagnosis not present

## 2024-02-27 NOTE — ED Provider Notes (Signed)
 [name]    CSN: [id number] Arrival date & time: 02/27/24  9142      History   Chief Complaint Chief Complaint  Patient presents with   Toe Pain    I'm experiencing varying degrees of pain, stiffness and swelling on my left big Toe.  I did not bump it.  Can't wait to see my Primary Doctor. - Entered by patient    HPI Valerie Rojas is a 68 y.o. female.   Patient presents for evaluation of intermittent left great toe pain and swelling beginning 2 weeks ago.  Severity of length fluctuates.  Hurts to bend the toe but able to fully bear weight.  Pain is described as a stabbing and radiates to the midfoot at times.  Has been elevating, massaging and applying warm compresses which has been effective in managing symptoms.  Denies injury or trauma.  Denies numbness or tingling.  Past Medical History:  Diagnosis Date   Anemia    pt reports a while back   Discoid lupus    Hyperlipidemia    high level on meds to prevent getting worse   Hypertension    Liver disease    Other specified disorders of liver    In process of dx issues with liver/bile duct    Patient Active Problem List   Diagnosis Date Noted   Elevated liver enzymes 04/03/2013   Anemia 04/03/2013   GERD 09/02/2007   CHEST PAIN UNSPECIFIED 09/02/2007    Past Surgical History:  Procedure Laterality Date   COLONOSCOPY  2011-[facility]   LIVER BIOPSY      OB History   No obstetric history on file.      Home Medications    Prior to Admission medications   Medication Sig Start Date End Date Taking? Authorizing Provider  amLODipine (NORVASC) 5 MG tablet Take 5 mg by mouth daily.    [provider]  carvedilol (COREG) 3.125 MG tablet Take 3.125 mg by mouth. 12/07/23 12/06/24  [provider]  Cholecalciferol 125 MCG (5000 UT) TABS Take 1 tablet by mouth daily.    [provider]  EPINEPHrine 0.3 mg/0.3 mL IJ SOAJ injection as needed.    [provider]  LIVDELZI 10 MG CAPS  Take 1 capsule by mouth daily.    [provider]  Obeticholic Acid  (OCALIVA ) 5 MG TABS Take 5 mg by mouth daily. Patient not taking: Reported on 02/01/2024 04/28/22   Eda Iha, MD  ursodiol (ACTIGALL) 500 MG tablet Take 500 mg by mouth 2 (two) times daily. 02/03/21   [provider]  vitamin B-12 (CYANOCOBALAMIN) 100 MCG tablet Take 100 mcg by mouth daily.    [provider]    Family History Family History  Problem Relation Age of Onset   Heart disease Mother    Stroke Mother    Diabetes Father    Diabetes Brother    Colon cancer Paternal Aunt        31's at dx   Heart disease Maternal Grandmother    Diabetes Paternal Grandmother    Esophageal cancer Neg Hx    Rectal cancer Neg Hx    Stomach cancer Neg Hx    Colon polyps Neg Hx    Pancreatic cancer Neg Hx     Social History Social History   Tobacco Use   Smoking status: Never   Smokeless tobacco: Never  Vaping Use   Vaping status: Never Used  Substance Use Topics   Alcohol use:  No   Drug use: No     Allergies   Patient has no known allergies.   Review of Systems Review of Systems   Physical Exam Triage Vital Signs ED Triage Vitals  Encounter Vitals Group     BP 02/27/24 0908 134/85     Girls Systolic BP Percentile --      Girls Diastolic BP Percentile --      Boys Systolic BP Percentile --      Boys Diastolic BP Percentile --      Pulse Rate 02/27/24 0908 67     Resp 02/27/24 0908 18     Temp 02/27/24 0908 97.9 F (36.6 C)     Temp Source 02/27/24 0908 Oral     SpO2 02/27/24 0908 99 %     Weight --      Height --      Head Circumference --      Peak Flow --      Pain Score 02/27/24 0904 2     Pain Loc --      Pain Education --      Exclude from Growth Chart --    No data found.  Updated Vital Signs BP 134/85 (BP Location: Left Arm)   Pulse 67   Temp 97.9 F (36.6 C) (Oral)   Resp 18   SpO2 99%   Visual Acuity Right Eye Distance:   Left Eye Distance:    Bilateral Distance:    Right Eye Near:   Left Eye Near:    Bilateral Near:     Physical Exam Constitutional:      Appearance: Normal appearance.  Eyes:     Extraocular Movements: Extraocular movements intact.  Pulmonary:     Effort: Pulmonary effort is normal.  Musculoskeletal:     Comments: Tenderness present to the proximal phalanx of the left great toe, no swelling, ecchymosis or deformity, pain elicited with flexion but able to fully extend and bear weight, 2+ pedal pulse  Neurological:     Mental Status: She is alert and oriented to person, place, and time. Mental status is at baseline.      UC Treatments / Results  Labs (all labs ordered are listed, but only abnormal results are displayed) Labs Reviewed - No data to display  EKG   Radiology No results found.  Procedures Procedures (including critical care time)  Medications Ordered in UC Medications - No data to display  Initial Impression / Assessment and Plan / UC Course  I have reviewed the triage vital signs and the nursing notes.  Pertinent labs & imaging results that were available during my care of the patient were reviewed by me and considered in my medical decision making (see chart for details).  Toe swelling  Denies injury therefore imaging deferred, discussed this with patient, etiology most likely inflammatory conditions such as osteoarthritis, low suspicion for gout due to intermittent occurrence, discussed, given written handout regarding osteoarthritis, recommended continued nonpharmacological supportive care with addition of over-the-counter diclofenac gel as patient would like to defer use of oral medicine, advise follow-up for symptoms worsening severity, given walking referral to orthopedics Final Clinical Impressions(s) / UC Diagnoses   Final diagnoses:  Toe swelling     Discharge Instructions      Today you were evaluated for the swelling and pain to your left great toe and as it  has been waxing and waning is most consistent with an inflammatory condition such as arthritis, low suspicion that you  would have a gout due to this being a buildup in uric acid typically causing constant inconsistent pain, swelling, redness to the joint  You may purchase over-the-counter diclofenac/Voltaren gel and use as needed over the affected area, this helps to reduce inflammation and helps with pain  If pain becomes severe you may use over-the-counter ibuprofen  You may continue use of warm compresses or heat over the affected area 10 to 15-minute intervals  You may continue to elevate whenever sitting and lying to help reduce swelling  You may continue activity as tolerated  At any point if your symptoms worsen in severity you may follow-up with urgent care for reevaluation, may also follow-up with orthopedic specialist, information is listed in front page   ED Prescriptions   None    PDMP not reviewed this encounter.   Teresa Shelba SAUNDERS, NP 02/27/24 1016

## 2024-02-27 NOTE — Discharge Instructions (Addendum)
 Today you were evaluated for the swelling and pain to your left great toe and as it has been waxing and waning is most consistent with an inflammatory condition such as arthritis, low suspicion that you would have a gout due to this being a buildup in uric acid typically causing constant inconsistent pain, swelling, redness to the joint  You may purchase over-the-counter diclofenac/Voltaren gel and use as needed over the affected area, this helps to reduce inflammation and helps with pain  If pain becomes severe you may use over-the-counter ibuprofen  You may continue use of warm compresses or heat over the affected area 10 to 15-minute intervals  You may continue to elevate whenever sitting and lying to help reduce swelling  You may continue activity as tolerated  At any point if your symptoms worsen in severity you may follow-up with urgent care for reevaluation, may also follow-up with orthopedic specialist, information is listed in front page

## 2024-02-27 NOTE — ED Triage Notes (Signed)
 Patient reports pain in left big toe with intermittent swelling x 2 weeks. Feels pain toe joint. Patient denies injury. Patient has not taken anything for symptoms. Rates pain 2/10.

## 2024-04-11 ENCOUNTER — Encounter: Payer: Self-pay | Admitting: Internal Medicine

## 2024-04-12 ENCOUNTER — Encounter: Payer: Self-pay | Admitting: General Practice

## 2024-04-12 ENCOUNTER — Ambulatory Visit (INDEPENDENT_AMBULATORY_CARE_PROVIDER_SITE_OTHER): Admitting: General Practice

## 2024-04-12 VITALS — BP 136/80 | HR 83 | Temp 98.0°F | Ht 62.0 in | Wt 127.0 lb

## 2024-04-12 DIAGNOSIS — E782 Mixed hyperlipidemia: Secondary | ICD-10-CM

## 2024-04-12 DIAGNOSIS — Z1231 Encounter for screening mammogram for malignant neoplasm of breast: Secondary | ICD-10-CM

## 2024-04-12 DIAGNOSIS — K743 Primary biliary cirrhosis: Secondary | ICD-10-CM | POA: Diagnosis not present

## 2024-04-12 DIAGNOSIS — M3219 Other organ or system involvement in systemic lupus erythematosus: Secondary | ICD-10-CM

## 2024-04-12 DIAGNOSIS — K766 Portal hypertension: Secondary | ICD-10-CM | POA: Diagnosis not present

## 2024-04-12 DIAGNOSIS — M810 Age-related osteoporosis without current pathological fracture: Secondary | ICD-10-CM | POA: Diagnosis not present

## 2024-04-12 DIAGNOSIS — R7303 Prediabetes: Secondary | ICD-10-CM | POA: Diagnosis not present

## 2024-04-12 DIAGNOSIS — Z7689 Persons encountering health services in other specified circumstances: Secondary | ICD-10-CM | POA: Insufficient documentation

## 2024-04-12 NOTE — Progress Notes (Signed)
 New Patient Office Visit  Subjective    Patient ID: [name], female    DOB: 1956-03-12  Age: 68 y.o. MRN: [id number]  CC:  Chief Complaint  Patient presents with   New Patient (Initial Visit)    Establish care    HPI Valerie Rojas is a 68 y.o. female presents to establish care. Previous PCP/physical/labs: Luke Lamer, MD.   Discussed the use of AI scribe software for clinical note transcription with the patient, who gave verbal consent to proceed.  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   She has a history of primary biliary cholangitis (PBC) and lupus, managed with a holistic approach and under the care of a liver specialist at [facility]. Her liver enzymes have fluctuated over the years, with recent tests showing normal ALT levels as of May 2025, but alkaline phosphatase remains elevated. She takes Ursodiol 500 mg BID for PBC and recently started Livdelzi 10 mg once daily, which she tolerates well. Previous medications like Ocaliva  and Eraqlo were not tolerated due to side effects.  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 She attributes her cirrhosis to PBC.  She has a long-standing history of lupus, diagnosed in the late 1970s to early 1980s, managed holistically without steroids. No current lung involvement or recent flare-ups, though past flare-ups caused significant discomfort and impacted sleep.  She has osteoporosis and takes vitamin D, K2, and magnesium supplements.   Her social history includes being widowed, impacting her financially and emotionally. She grows her own vegetables, maintains a healthy lifestyle, avoids fried foods and eating out, and participates in support groups for liver conditions and PBC.    Outpatient Encounter Medications as of 04/12/2024  Medication Sig    carvedilol (COREG) 3.125 MG tablet Take 3.125 mg by mouth 2 (two) times daily with a meal.   Cholecalciferol (VITAMIN D3) LIQD by Does not apply route.   LIVDELZI 10 MG CAPS Take 1 capsule by mouth daily.   ursodiol (ACTIGALL) 500 MG tablet Take 500 mg by mouth 2 (two) times daily.   vitamin B-12 (CYANOCOBALAMIN) 100 MCG tablet Take 100 mcg by mouth daily.   [DISCONTINUED] amLODipine (NORVASC) 5 MG tablet Take 5 mg by mouth daily.   [DISCONTINUED] Cholecalciferol 125 MCG (5000 UT) TABS Take 1 tablet by mouth daily.   [DISCONTINUED] EPINEPHrine 0.3 mg/0.3 mL IJ SOAJ injection as needed.   [DISCONTINUED] Obeticholic Acid  (OCALIVA ) 5 MG TABS Take 5 mg by mouth daily. (Patient not taking: Reported on 02/01/2024)   Facility-Administered Encounter Medications as of 04/12/2024  Medication   0.9 %  sodium chloride  infusion    Past Medical History:  Diagnosis Date   Anemia    pt reports a while back   Discoid lupus    Hyperlipidemia    high level on meds to prevent getting worse   Hypertension    Liver disease    Other specified disorders of liver    In process of dx issues with liver/bile duct    Past Surgical History:  Procedure Laterality Date   COLONOSCOPY  2011-[facility]   LIVER BIOPSY      Family History  Problem Relation Age of Onset   Heart disease Mother    Stroke Mother    Diabetes Father    Diabetes Brother    Colon cancer Paternal  Aunt        50's at dx   Heart disease Maternal Grandmother    Diabetes Paternal Grandmother    Esophageal cancer Neg Hx    Rectal cancer Neg Hx    Stomach cancer Neg Hx    Colon polyps Neg Hx    Pancreatic cancer Neg Hx     Social History   Socioeconomic History   Marital status: Widowed    Spouse name: Not on file   Number of children: Not on file   Years of education: Not on file   Highest education level: GED or equivalent  Occupational History   Not on file  Tobacco Use   Smoking status: Never   Smokeless tobacco: Never   Vaping Use   Vaping status: Never Used  Substance and Sexual Activity   Alcohol use: No   Drug use: No   Sexual activity: Yes  Other Topics Concern   Not on file  Social History Narrative   Not on file   Social Drivers of Health   Financial Resource Strain: Low Risk  (04/11/2024)   Overall Financial Resource Strain (CARDIA)    Difficulty of Paying Living Expenses: Not very hard  Food Insecurity: No Food Insecurity (04/11/2024)   Hunger Vital Sign    Worried About Running Out of Food in the Last Year: Never true    Ran Out of Food in the Last Year: Never true  Transportation Needs: No Transportation Needs (04/11/2024)   PRAPARE - Administrator, Civil Service (Medical): No    Lack of Transportation (Non-Medical): No  Physical Activity: Sufficiently Active (04/11/2024)   Exercise Vital Sign    Days of Exercise per Week: 5 days    Minutes of Exercise per Session: 90 min  Stress: No Stress Concern Present (04/11/2024)   [employer] of Occupational Health - Occupational Stress Questionnaire    Feeling of Stress: Only a little  Social Connections: Moderately Integrated (04/11/2024)   Social Connection and Isolation Panel    Frequency of Communication with Friends and Family: More than three times a week    Frequency of Social Gatherings with Friends and Family: Once a week    Attends Religious Services: 1 to 4 times per year    Active Member of [employer] or Organizations: Yes    Attends Banker Meetings: 1 to 4 times per year    Marital Status: Widowed  Intimate Partner Violence: Not on file    Review of Systems  Constitutional:  Negative for chills and fever.  Respiratory:  Negative for shortness of breath.   Cardiovascular:  Negative for chest pain.  Gastrointestinal:  Negative for abdominal pain, constipation, diarrhea, heartburn, nausea and vomiting.  Genitourinary:  Negative for dysuria, frequency and urgency.  Neurological:  Negative for  dizziness and headaches.  Endo/Heme/Allergies:  Negative for polydipsia.  Psychiatric/Behavioral:  Negative for depression and suicidal ideas. The patient is not nervous/anxious.         Objective    BP 136/80   Pulse 83   Temp 98 F (36.7 C) (Oral)   Ht 5' 2 (1.575 m)   Wt 127 lb (57.6 kg)   SpO2 98%   BMI 23.23 kg/m   Physical Exam Vitals and nursing note reviewed.  Constitutional:      Appearance: Normal appearance.  Cardiovascular:     Rate and Rhythm: Normal rate and regular rhythm.     Pulses: Normal pulses.     Heart  sounds: Normal heart sounds.  Pulmonary:     Effort: Pulmonary effort is normal.     Breath sounds: Normal breath sounds.  Neurological:     Mental Status: She is alert and oriented to person, place, and time.  Psychiatric:        Mood and Affect: Mood normal.        Behavior: Behavior normal.        Thought Content: Thought content normal.        Judgment: Judgment normal.         Assessment & Plan:  Primary biliary cholangitis (HCC)  Establishing care with new doctor, encounter for  Other systemic lupus erythematosus with other organ involvement (HCC)  Portal hypertension (HCC)  Hepatic 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 (HCC)  Age-related osteoporosis without current pathological fracture  Encounter for screening mammogram for malignant neoplasm of breast -     3D Screening Mammogram, Left and Right; Future  Mixed hyperlipidemia  Prediabetes    Assessment and Plan Assessment & Plan Primary biliary cholangitis with cirrhosis and portal hypertension Chronic PBC with cirrhosis and portal hypertension. Liver enzymes improved with current regimen. Ursodiol, Levodopa, and Carvedilol well tolerated. Previous intolerance to Ocaliva  and Eraqlo. - Continue Ursodiol and Levdelzi.  - Continue Carvedilol 3.125 mg twice daily. - Followed by liver specialist at [facility]. - Discuss osteoporosis medications with liver  specialist.  Systemic lupus erythematosus Long-standing SLE managed holistically. Not under rheumatology care. - Monitor for lupus flare-ups. - Consider rheumatology referral if flare-ups occur.  Osteoporosis Osteoporosis managed with vitamin D, K2, and magnesium. No bisphosphonates due to liver concerns. Bone density scan in May 2025. - Discuss osteoporosis treatment options with liver specialist. - Continue vitamin D, K2, and magnesium. - Reviewed dexa scan from May, 2025.   Hypercholesterolemia Previously managed with fenofibrate. Cholesterol improved with liver treatment. - Monitor cholesterol levels in January.  Anemia Chronic low hemoglobin.  - will check labs at next visit.   Return in about 3 months (around 07/13/2024) for physical and  fasting labs.SABRA Carrol Aurora, NP

## 2024-04-12 NOTE — Patient Instructions (Addendum)
 Discuss Fosamax or Prolia with your liver specialist.   Let me know if you need a referral to rheumatology for lupus.   Follow up in 3 months for physical and fasting labs.   It was a pleasure to meet you today! Please don't hesitate to contact me with any questions. Welcome to [facility]!

## 2024-04-24 ENCOUNTER — Emergency Department

## 2024-04-24 ENCOUNTER — Observation Stay
Admission: EM | Admit: 2024-04-24 | Discharge: 2024-04-25 | Disposition: A | Discharge status: Home / Self Care | Attending: Internal Medicine | Admitting: Internal Medicine

## 2024-04-24 ENCOUNTER — Other Ambulatory Visit: Payer: Self-pay

## 2024-04-24 DIAGNOSIS — I6622 Occlusion and stenosis of left posterior cerebral artery: Secondary | ICD-10-CM

## 2024-04-24 DIAGNOSIS — D61818 Other pancytopenia: Secondary | ICD-10-CM

## 2024-04-24 DIAGNOSIS — Z79899 Other long term (current) drug therapy: Secondary | ICD-10-CM | POA: Diagnosis not present

## 2024-04-24 DIAGNOSIS — Z7982 Long term (current) use of aspirin: Secondary | ICD-10-CM | POA: Diagnosis not present

## 2024-04-24 DIAGNOSIS — D72819 Decreased white blood cell count, unspecified: Secondary | ICD-10-CM

## 2024-04-24 DIAGNOSIS — R55 Syncope and collapse: Secondary | ICD-10-CM | POA: Diagnosis present

## 2024-04-24 DIAGNOSIS — I160 Hypertensive urgency: Secondary | ICD-10-CM | POA: Diagnosis not present

## 2024-04-24 DIAGNOSIS — K743 Primary biliary cirrhosis: Secondary | ICD-10-CM | POA: Diagnosis not present

## 2024-04-24 DIAGNOSIS — L930 Discoid lupus erythematosus: Secondary | ICD-10-CM | POA: Diagnosis not present

## 2024-04-24 DIAGNOSIS — R42 Dizziness and giddiness: Secondary | ICD-10-CM

## 2024-04-24 DIAGNOSIS — G459 Transient cerebral ischemic attack, unspecified: Principal | ICD-10-CM

## 2024-04-24 DIAGNOSIS — D72820 Lymphocytosis (symptomatic): Secondary | ICD-10-CM

## 2024-04-24 DIAGNOSIS — I6522 Occlusion and stenosis of left carotid artery: Secondary | ICD-10-CM

## 2024-04-24 DIAGNOSIS — R2689 Other abnormalities of gait and mobility: Secondary | ICD-10-CM | POA: Diagnosis not present

## 2024-04-24 DIAGNOSIS — I639 Cerebral infarction, unspecified: Principal | ICD-10-CM

## 2024-04-24 LAB — HEPATIC FUNCTION PANEL
ALT: 32 U/L (ref 0–44)
AST: 50 U/L — ABNORMAL HIGH (ref 15–41)
Albumin: 3.8 g/dL (ref 3.5–5.0)
Alkaline Phosphatase: 275 U/L — ABNORMAL HIGH (ref 38–126)
Bilirubin, Direct: 0.5 mg/dL — ABNORMAL HIGH (ref 0.0–0.2)
Indirect Bilirubin: 0.8 mg/dL (ref 0.3–0.9)
Total Bilirubin: 1.3 mg/dL — ABNORMAL HIGH (ref 0.0–1.2)
Total Protein: 8.4 g/dL — ABNORMAL HIGH (ref 6.5–8.1)

## 2024-04-24 LAB — CBG MONITORING, ED: Glucose-Capillary: 100 mg/dL — ABNORMAL HIGH (ref 70–99)

## 2024-04-24 LAB — BASIC METABOLIC PANEL WITH GFR
Anion gap: 13 (ref 5–15)
BUN: 18 mg/dL (ref 8–23)
CO2: 25 mmol/L (ref 22–32)
Calcium: 9.6 mg/dL (ref 8.9–10.3)
Chloride: 104 mmol/L (ref 98–111)
Creatinine, Ser: 0.99 mg/dL (ref 0.44–1.00)
GFR, Estimated: >60 mL/min (ref >60)
Glucose, Bld: 105 mg/dL — ABNORMAL HIGH (ref 70–99)
Potassium: 3.6 mmol/L (ref 3.5–5.1)
Sodium: 142 mmol/L (ref 135–145)

## 2024-04-24 LAB — CBC
HCT: 31.8 % — ABNORMAL LOW (ref 36.0–46.0)
Hemoglobin: 9.9 g/dL — ABNORMAL LOW (ref 12.0–15.0)
MCH: 22 pg — ABNORMAL LOW (ref 26.0–34.0)
MCHC: 31.1 g/dL (ref 30.0–36.0)
MCV: 70.5 fL — ABNORMAL LOW (ref 80.0–100.0)
Platelets: 104 K/uL — ABNORMAL LOW (ref 150–400)
RBC: 4.51 MIL/uL (ref 3.87–5.11)
RDW: 15.9 % — ABNORMAL HIGH (ref 11.5–15.5)
WBC: 3.8 K/uL — ABNORMAL LOW (ref 4.0–10.5)
nRBC: 0 % (ref 0.0–0.2)

## 2024-04-24 LAB — URINALYSIS, ROUTINE W REFLEX MICROSCOPIC
Bilirubin Urine: NEGATIVE
Glucose, UA: NEGATIVE mg/dL
Hgb urine dipstick: NEGATIVE
Ketones, ur: NEGATIVE mg/dL
Leukocytes,Ua: NEGATIVE
Nitrite: NEGATIVE
Protein, ur: NEGATIVE mg/dL
Specific Gravity, Urine: 1.008 (ref 1.005–1.030)
pH: 7 (ref 5.0–8.0)

## 2024-04-24 LAB — TROPONIN I (HIGH SENSITIVITY)
Troponin I (High Sensitivity): 7 ng/L (ref <18)
Troponin I (High Sensitivity): 7 ng/L (ref <18)

## 2024-04-24 LAB — BRAIN NATRIURETIC PEPTIDE: B Natriuretic Peptide: 64.9 pg/mL (ref 0.0–100.0)

## 2024-04-24 LAB — LIPASE, BLOOD: Lipase: 30 U/L (ref 11–51)

## 2024-04-24 MED ORDER — CLOPIDOGREL BISULFATE 75 MG PO TABS
75.0000 mg | ORAL_TABLET | Freq: Every day | ORAL | Status: DC
Start: 2024-04-24 — End: 2024-04-25
  Administered 2024-04-24 – 2024-04-25 (×2): 75 mg via ORAL
  Filled 2024-04-24 (×2): qty 1

## 2024-04-24 MED ORDER — MECLIZINE HCL 25 MG PO TABS: 25.0000 mg | ORAL_TABLET | Freq: Three times a day (TID) | ORAL | Status: DC | PRN

## 2024-04-24 MED ORDER — SODIUM CHLORIDE 0.9 % IV SOLN: INTRAVENOUS | Status: DC

## 2024-04-24 MED ORDER — ACETAMINOPHEN 160 MG/5ML PO SOLN: 650.0000 mg | ORAL | Status: DC | PRN

## 2024-04-24 MED ORDER — ACETAMINOPHEN 650 MG RE SUPP: 650.0000 mg | RECTAL | Status: DC | PRN

## 2024-04-24 MED ORDER — SODIUM CHLORIDE 0.9 % IV BOLUS
1000.0000 mL | Freq: Once | INTRAVENOUS | Status: AC
  Administered 2024-04-24: 1000 mL via INTRAVENOUS

## 2024-04-24 MED ORDER — ENOXAPARIN SODIUM 40 MG/0.4ML IJ SOSY
40.0000 mg | PREFILLED_SYRINGE | INTRAMUSCULAR | Status: DC
  Filled 2024-04-24: qty 0.4

## 2024-04-24 MED ORDER — MECLIZINE HCL 25 MG PO TABS
25.0000 mg | ORAL_TABLET | Freq: Once | ORAL | Status: AC
  Administered 2024-04-24: 25 mg via ORAL
  Filled 2024-04-24: qty 1

## 2024-04-24 MED ORDER — STROKE: EARLY STAGES OF RECOVERY BOOK: Freq: Once | Status: AC

## 2024-04-24 MED ORDER — ACETAMINOPHEN 325 MG PO TABS: 650.0000 mg | ORAL_TABLET | ORAL | Status: DC | PRN

## 2024-04-24 MED ORDER — IOHEXOL 350 MG/ML SOLN
75.0000 mL | Freq: Once | INTRAVENOUS | Status: AC | PRN
  Administered 2024-04-24: 75 mL via INTRAVENOUS

## 2024-04-24 MED ORDER — ASPIRIN 81 MG PO CHEW
81.0000 mg | CHEWABLE_TABLET | Freq: Every day | ORAL | Status: DC
  Administered 2024-04-24 – 2024-04-25 (×2): 81 mg via ORAL
  Filled 2024-04-24 (×2): qty 1

## 2024-04-24 NOTE — ED Triage Notes (Addendum)
 Pt arrives via [facility] from home for a near syncope episode. Pt felt tightness in her chest and stated her vision started going black but did not pass out. Hx of primary biliary cholangitis. When pt is laying in the bed, she does not feel dizzy.  EMS vitals: 210/100 BP

## 2024-04-24 NOTE — Assessment & Plan Note (Signed)
 Likely secondary to chronic liver disease.  Stable.

## 2024-04-24 NOTE — Assessment & Plan Note (Signed)
 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

## 2024-04-24 NOTE — ED Notes (Signed)
 Dr Cleatus stated BP is okay as long as it stays under 210 or 220 systolic

## 2024-04-24 NOTE — ED Notes (Signed)
 Pt states she gets SOB when she gets up to use the restroom. ED provider aware and ordered an Xray and a BNP lab.

## 2024-04-24 NOTE — Assessment & Plan Note (Addendum)
 Patient with an episode of low sternal, epigastric discomfort, shortness of breath and palpitations while in the ED that occurred on standing from commode, lasting about 5 minutes Chest x-ray with mild cardiomegaly Continuous cardiac monitoring to evaluate for arrhythmias Close troponin negative, will get a second Patient very active at bedside so low suspicion for PE Low suspicion for acute aortic syndrome, given main symptom of dizziness and palpitations and not chest or back pain Close hemodynamic monitoring Echocardiogram

## 2024-04-24 NOTE — ED Notes (Signed)
 Code stroke was called on this pt but ED Provider did not order a CT at the time of code stroke because pt already had a CT of the head and neck prior

## 2024-04-24 NOTE — Assessment & Plan Note (Deleted)
  Patient presenting with a transient episode of vertigo which recurred in the ED as well as feeling like she was about to pass out

## 2024-04-24 NOTE — Assessment & Plan Note (Addendum)
 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% Patient presenting with episodes of transient vertigo x 2, feeling to pass out Stroke alert in the ED: Not a tPA or thrombectomy candidate due to NIH 0 last seen by teleneurology in the ED Permissive hypertension for first 24-48 hrs post stroke onset: Prn Labetalol IV or Vasotec IV If BP greater than 220/120  Statins for LDL goal less than 70 ASA 81mg  daily, Plavix 75mg  daily x 3 weeks then monotherapy thereafter Telemetry, echocardiogram Avoid dextrose containing fluids, Maintain euglycemia, euthermia Neuro checks q4 hrs x 24 hrs and then per shift Head of bed 30 degrees Physical therapy/Occupational therapy/Speech therapy if failed dysphagia screen Can consider neurology inpatient consult Meclizine as needed for vertigo

## 2024-04-24 NOTE — ED Provider Notes (Signed)
 [facility] Provider Note    Event Date/Time   First MD Initiated Contact with Patient 04/24/24 1608     (approximate)   History   Near Syncope   HPI  Valerie Rojas is a 68 y.o. female with past medical history of hypertension, HLP, SLE and cholestatic liver disease presumed secondary to PBC versus PSC who presents to the emergency department with an episode of near syncope, vertigo resolved on arrival.  Patient states that she was in her usual state of health and was feeding the chickens in her yard when she developed the sensation as if the room was spinning.  She has no prior history of vertigo.  She felt so lightheaded that she needed to lay down and crawl afterwards to obtain help from family.  Her symptoms resolved within 15 minutes and patient reports that she is asymptomatic while laying in the bed.  She denies any worsening headache, sensation changes, or falls.  She denies any chest pain but does report that she had shortness of breath when she was symptomatic.  She is compliant with her medications and reports that she recently initiated a new medication for her liver.  Patient believes that her last known normal was approximately between 2 to 3 PM      Physical Exam   Triage Vital Signs: ED Triage Vitals [04/24/24 1607]  Encounter Vitals Group     BP (!) 197/94     Girls Systolic BP Percentile      Girls Diastolic BP Percentile      Boys Systolic BP Percentile      Boys Diastolic BP Percentile      Pulse Rate 79     Resp 15     Temp      Temp src      SpO2 100 %     Weight 127 lb (57.6 kg)     Height 5' 2 (1.575 m)     Head Circumference      Peak Flow      Pain Score 0     Pain Loc      Pain Education      Exclude from Growth Chart     Most recent vital signs: Vitals:   04/24/24 2130 04/25/24 0024  BP: (!) 199/81 (!) 164/63  Pulse: 74 75  Resp:  16  Temp:  97.8 F (36.6 C)  SpO2: 100% 100%    Nursing Triage Note  reviewed. Vital signs reviewed and patients oxygen saturation is normoxic  General: Patient is well nourished, well developed, awake and alert, resting comfortably in no acute distress Head: Normocephalic and atraumatic Eyes: Normal inspection, extraocular muscles intact, no conjunctival pallor Ear, nose, throat: Normal external exam Neck: Normal range of motion Respiratory: Patient is in no respiratory distress, lungs CTAB Cardiovascular: Patient is not tachycardic, RRR without murmur appreciated GI: Abd SNT with no guarding or rebound  Back: Normal inspection of the back with good strength and range of motion throughout all ext Extremities: pulses intact with good cap refills, no LE pitting edema or calf tenderness Neuro: The patient is alert and oriented to person, place, and time, appropriately conversive, with 5/5 bilat UE/LE strength, no gross motor or sensory defects noted. Coordination appears to be adequate.  There is no dysmetria to finger-to-nose or rapid hand, she is able to heel shin and there is no ataxia on ambulation Skin: Warm, dry, and intact Psych: normal mood and affect, no SI or  HI  ED Results / Procedures / Treatments   Labs (all labs ordered are listed, but only abnormal results are displayed) Labs Reviewed  CBC - Abnormal; Notable for the following components:      Result Value   WBC 3.8 (*)    Hemoglobin 9.9 (*)    HCT 31.8 (*)    MCV 70.5 (*)    MCH 22.0 (*)    RDW 15.9 (*)    Platelets 104 (*)    All other components within normal limits  BASIC METABOLIC PANEL WITH GFR - Abnormal; Notable for the following components:   Glucose, Bld 105 (*)    All other components within normal limits  URINALYSIS, ROUTINE W REFLEX MICROSCOPIC - Abnormal; Notable for the following components:   Color, Urine YELLOW (*)    APPearance CLEAR (*)    All other components within normal limits  HEPATIC FUNCTION PANEL - Abnormal; Notable for the following components:   Total  Protein 8.4 (*)    AST 50 (*)    Alkaline Phosphatase 275 (*)    Total Bilirubin 1.3 (*)    Bilirubin, Direct 0.5 (*)    All other components within normal limits  CBG MONITORING, ED - Abnormal; Notable for the following components:   Glucose-Capillary 100 (*)    All other components within normal limits  LIPASE, BLOOD  BRAIN NATRIURETIC PEPTIDE  HIV ANTIBODY (ROUTINE TESTING W REFLEX)  LIPID PANEL  HEMOGLOBIN A1C  TROPONIN I (HIGH SENSITIVITY)  TROPONIN I (HIGH SENSITIVITY)  TROPONIN I (HIGH SENSITIVITY)     EKG EKG and rhythm strip are interpreted by myself:   EKG: [Normal sinus rhythm] at heart rate of 76, normal QRS duration, QTc 438, normal ST segments and T waves no ectopy EKG not consistent with Acute STEMI Rhythm strip: NSR in lead II   RADIOLOGY CXR: No acute abnormality on my independent review interpretation radiologist agrees CT angio head and neck with and without contrast: Occlusion of the distal left posterior cerebral artery P3 segment     PROCEDURES:  Critical Care performed: Yes, see critical care procedure note(s)  .Critical Care  Performed by: Nicholaus Rolland BRAVO, MD Authorized by: Nicholaus Rolland BRAVO, MD   Critical care provider statement:    Critical care time (minutes):  30   Critical care was necessary to treat or prevent imminent or life-threatening deterioration of the following conditions:  CNS failure or compromise   Critical care was time spent personally by me on the following activities:  Development of treatment plan with patient or surrogate, discussions with consultants, evaluation of patient's response to treatment, examination of patient, ordering and review of laboratory studies, ordering and review of radiographic studies, ordering and performing treatments and interventions, pulse oximetry, re-evaluation of patient's condition and review of old charts   Care discussed with: admitting provider   Comments:     Patient made his acute  stroke alert for reoccurrence of symptoms and decision not to give TNK along with discussions with neurology and hospitalist    MEDICATIONS ORDERED IN ED: Medications  aspirin chewable tablet 81 mg (81 mg Oral Given 04/24/24 2147)  clopidogrel (PLAVIX) tablet 75 mg (75 mg Oral Given 04/24/24 2147)  meclizine (ANTIVERT) tablet 25 mg (has no administration in time range)   stroke: early stages of recovery book (has no administration in time range)  0.9 %  sodium chloride  infusion ( Intravenous New Bag/Given 04/24/24 2102)  acetaminophen (TYLENOL) tablet 650 mg (has no administration in  time range)    Or  acetaminophen (TYLENOL) 160 MG/5ML solution 650 mg (has no administration in time range)    Or  acetaminophen (TYLENOL) suppository 650 mg (has no administration in time range)  enoxaparin (LOVENOX) injection 40 mg (40 mg Subcutaneous Not Given 04/24/24 2354)  sodium chloride  0.9 % bolus 1,000 mL (0 mLs Intravenous Stopped 04/24/24 1829)  meclizine (ANTIVERT) tablet 25 mg (25 mg Oral Given 04/24/24 1644)  iohexol (OMNIPAQUE) 350 MG/ML injection 75 mL (75 mLs Intravenous Contrast Given 04/24/24 1728)     IMPRESSION / MDM / ASSESSMENT AND PLAN / ED COURSE                                Differential diagnosis includes, but is not limited to, TIA, subacute CVA, Mnire's disease, BPV, arrhythmia, electrolyte derangement anemia  ED course: Patient presents without any focal neurological deficits with NIH stroke score of 0.  Given that this is the first time episode of vertigo, I am concerned about the possibility of TIA.  I did consider initially making the patient acute stroke alert on arrival but given the lack of deficits I do not think this is warranted.  EKG demonstrated no evidence of arrhythmia.  She had no anemia or electrolyte derangement.  Chest x-ray demonstrated no pneumonia or pleural edema.  CT head demonstrated no intracranial hemorrhage with CT angio head and neck was  concerning for a P2 occlusion which I think is consistent with the patient's symptoms.  Had the patient ambulate to give a urine specimen the patient had a reoccurrence of symptoms.  Given this, I did decide at this time to make the patient an acute stroke alert and case discussed with stroke teleneurologist.  Luckily the patients symptoms rapidly resolved and there is no indication for TNK or embolectomy at this time.  Case discussed with hospitalist for admission   Clinical Course as of 04/25/24 0044  [facility] Apr 24, 2024  1832 Basic metabolic panel(!) No electrolyte derangements [HD]  1832 CBC(!) Mild anemia but no leukocytosis [HD]  1832 Hepatic function panel(!) Seem to be at baseline for the patient [HD]  1832 B Natriuretic Peptide: 64.9 Not elevated [HD]  1832 Urinalysis, Routine w reflex microscopic -Urine, Clean Catch(!) Not consistent with infection [HD]  1832 DG Chest 2 View Mild cardiomegaly [HD]  1841 Family is at bedside and they state that the patient was last known normal per them at 3 PM patient was asymptomatic but subsequently became symptomatic when walking back to the bathroom at 4:45 PM.  Given that her CT imaging does demonstrate a very small stroke we will make this patient a stroke alert and have neurology evaluate [HD]  2026 Case discussed with hospitalist for admission [HD]    Clinical Course User Index [HD] Nicholaus Rolland BRAVO, MD   At time of discharge there is no evidence of acute life, limb, vision, or fertility threat. Patient has stable vital signs, pain is well controlled, patient is ambulatory and p.o. tolerant.  Discharge instructions were completed using the EPIC system. I would refer you to those at this time. All warnings prescriptions follow-up etc. were discussed in detail with the patient. Patient indicates understanding and is agreeable with this plan. All questions answered.  Patient is made aware that they may return to the emergency department for any  worsening or new condition or for any other emergency.   -- Risk: 5 This  patient has a high risk of morbidity due to further diagnostic testing or treatment. Rationale: This patient's evaluation and management involve a high risk of morbidity due to the potential severity of presenting symptoms, need for diagnostic testing, and/or initiation of treatment that may require close monitoring. The differential includes conditions with potential for significant deterioration or requiring escalation of care. Treatment decisions in the ED, including medication administration, procedural interventions, or disposition planning, reflect this level of risk. COPA: 5 The patient has the following acute or chronic illness/injury that poses a possible threat to life or bodily function: [X] : The patient has a potentially serious acute condition or an acute exacerbation of a chronic illness requiring urgent evaluation and management in the Emergency Department. The clinical presentation necessitates immediate consideration of life-threatening or function-threatening diagnoses, even if they are ultimately ruled out.   FINAL CLINICAL IMPRESSION(S) / ED DIAGNOSES   Final diagnoses:  Acute CVA (cerebrovascular accident) (HCC)  Vertigo     Rx / DC Orders   ED Discharge Orders     None        Note:  This document was prepared using Dragon voice recognition software and may include unintentional dictation errors.   Nicholaus Rolland BRAVO, MD 04/25/24 [phone]

## 2024-04-24 NOTE — Assessment & Plan Note (Signed)
 Holding antihypertensives to allow for permissive hypertension

## 2024-04-24 NOTE — Assessment & Plan Note (Addendum)
 Holding antihypertensives unless SBP over 220 to allow for permissive hypertension

## 2024-04-24 NOTE — Assessment & Plan Note (Addendum)
 Portal hypertension No acute issues suspected Patient is followed at the [facility] liver clinic Continue home meds

## 2024-04-24 NOTE — ED Notes (Signed)
 Patient transported to X-ray

## 2024-04-24 NOTE — H&P (Signed)
 History and Physical    Patient: Valerie Rojas FMW:[id number] DOB: [birth year] DOA: 04/24/2024 DOS: the patient was seen and examined on 04/24/2024 PCP: Vincente Shivers, NP  Patient coming from: Home  Chief Complaint:  Chief Complaint  Patient presents with   Near Syncope    HPI: Valerie Rojas is a 68 y.o. female with medical history significant for Biliary cirrhosis with portal hypertension followed at [facility], discoid lupus, chronic pancytopenia, HTN, being admitted primarily for a TIA and presyncope workup.  Patient was out feeding her chickens when she experienced sudden onset dizziness and nausea, feeling like the place was spinning.  She denied chest or back pain, shortness of breath or palpitations.  She felt like she was going to pass out and lowered herself to the ground crawling back to the house where she called 911.  She did not fall and did not lose consciousness.  She states her blood pressure was in the 200s with EMS.   On arrival to the ED, symptoms had resolved but she stated she still felt a bit off. Vitals notable for BP 197-210, otherwise unremarkable.   While awaiting workup in the ED, stroke alert was activated after patient had an episode of sudden onset shortness of breath, palpitations and low sternal/epigastric tightness after standing up from the bedside commode.  She did not feel dizzy at the time.  Denied nausea or diaphoresis.  Symptoms again resolved after she returned to the stretcher.   NIH score 0.   Stat CTA head and neck was nonacute but showed high-grade stenosis/occlusion in the distal left PCA and a 45% stenosis left proximal internal carotid artery. She was seen in consultation by teleneurology who did not consider her a candidate for thrombectomy or tPA due to NIH of 0.  They recommended MRI and admission for stroke workup.  Subsequent MRI brain with no acute intracranial abnormality. Additional ED workup notable for the following: LFTs at  baseline for primary biliary cholangitis/cirrhosis CBC with baseline pancytopenia (WBC 3.8, hemoglobin 9.9, platelets 104) BMP, UA, troponin and BNP all within normal limits EKG showed sinus at 76 Chest x-ray mild cardiomegaly  Patient being admitted for stroke and presyncope workup     Review of Systems: As mentioned in the history of present illness. All other systems reviewed and are negative.  Past Medical History:  Diagnosis Date   Anemia    pt reports a while back   Discoid lupus    Hyperlipidemia    high level on meds to prevent getting worse   Hypertension    Liver disease    Other specified disorders of liver    In process of dx issues with liver/bile duct   Past Surgical History:  Procedure Laterality Date   COLONOSCOPY  2011-[facility]   LIVER BIOPSY     Social History:  reports that she has never smoked. She has never used smokeless tobacco. She reports that she does not drink alcohol and does not use drugs.  No Known Allergies  Family History  Problem Relation Age of Onset   Heart disease Mother    Stroke Mother    Diabetes Father    Diabetes Brother    Colon cancer Paternal Aunt        31's at dx   Heart disease Maternal Grandmother    Diabetes Paternal Grandmother    Esophageal cancer Neg Hx    Rectal cancer Neg Hx    Stomach cancer Neg Hx  Colon polyps Neg Hx    Pancreatic cancer Neg Hx     Prior to Admission medications   Medication Sig Start Date End Date Taking? Authorizing Provider  carvedilol (COREG) 3.125 MG tablet Take 3.125 mg by mouth 2 (two) times daily with a meal. 12/07/23 12/06/24  [provider]  Cholecalciferol (VITAMIN D3) LIQD by Does not apply route.    [provider]  LIVDELZI 10 MG CAPS Take 1 capsule by mouth daily.    [provider]  ursodiol (ACTIGALL) 500 MG tablet Take 500 mg by mouth 2 (two) times daily. 02/03/21   [provider]  vitamin B-12 (CYANOCOBALAMIN) 100 MCG tablet Take 100 mcg by  mouth daily.    [provider]    Physical Exam: Vitals:   04/24/24 1700 04/24/24 1815 04/24/24 1824 04/24/24 2021  BP: (!) 146/100 (!) 210/96 (!) 179/79   Pulse: 77 83 79   Resp: (!) 21 11 17    Temp:    98 F (36.7 C)  TempSrc:    Oral  SpO2: 100% 100% 100%   Weight:      Height:       Physical Exam Vitals and nursing note reviewed.  Constitutional:      General: She is not in acute distress. HENT:     Head: Normocephalic and atraumatic.  Cardiovascular:     Rate and Rhythm: Normal rate and regular rhythm.     Heart sounds: Normal heart sounds.  Pulmonary:     Effort: Pulmonary effort is normal.     Breath sounds: Normal breath sounds.  Abdominal:     Palpations: Abdomen is soft.     Tenderness: There is no abdominal tenderness.  Neurological:     Mental Status: Mental status is at baseline.     Labs on Admission: I have personally reviewed following labs and imaging studies  CBC: Recent Labs  Lab 04/24/24 1610  WBC 3.8*  HGB 9.9*  HCT 31.8*  MCV 70.5*  PLT 104*   Basic Metabolic Panel: Recent Labs  Lab 04/24/24 1610  NA 142  K 3.6  CL 104  CO2 25  GLUCOSE 105*  BUN 18  CREATININE 0.99  CALCIUM 9.6   GFR: Estimated Creatinine Clearance: 43 mL/min (by C-G formula based on SCr of 0.99 mg/dL). Liver Function Tests: Recent Labs  Lab 04/24/24 1610  AST 50*  ALT 32  ALKPHOS 275*  BILITOT 1.3*  PROT 8.4*  ALBUMIN 3.8   Recent Labs  Lab 04/24/24 1610  LIPASE 30   No results for input(s): AMMONIA in the last 168 hours. Coagulation Profile: No results for input(s): INR, PROTIME in the last 168 hours. Cardiac Enzymes: No results for input(s): CKTOTAL, CKMB, CKMBINDEX, TROPONINI in the last 168 hours. BNP (last 3 results) No results for input(s): PROBNP in the last 8760 hours. HbA1C: No results for input(s): HGBA1C in the last 72 hours. CBG: Recent Labs  Lab 04/24/24 1841  GLUCAP 100*   Lipid Profile: No  results for input(s): CHOL, HDL, LDLCALC, TRIG, CHOLHDL, LDLDIRECT in the last 72 hours. Thyroid Function Tests: No results for input(s): TSH, T4TOTAL, FREET4, T3FREE, THYROIDAB in the last 72 hours. Anemia Panel: No results for input(s): VITAMINB12, FOLATE, FERRITIN, TIBC, IRON, RETICCTPCT in the last 72 hours. Urine analysis:    Component Value Date/Time   COLORURINE YELLOW (A) 04/24/2024 1626   APPEARANCEUR CLEAR (A) 04/24/2024 1626   LABSPEC 1.008 04/24/2024 1626   PHURINE 7.0 04/24/2024 1626  GLUCOSEU NEGATIVE 04/24/2024 1626   HGBUR NEGATIVE 04/24/2024 1626   BILIRUBINUR NEGATIVE 04/24/2024 1626   KETONESUR NEGATIVE 04/24/2024 1626   PROTEINUR NEGATIVE 04/24/2024 1626   UROBILINOGEN 0.2 04/03/2013 1645   NITRITE NEGATIVE 04/24/2024 1626   LEUKOCYTESUR NEGATIVE 04/24/2024 1626    Radiological Exams on Admission: MR BRAIN WO CONTRAST Result Date: 04/24/2024 EXAM: MRI BRAIN WITHOUT CONTRAST 04/24/2024 07:55:58 PM TECHNIQUE: Multiplanar multisequence MRI of the head/brain was performed without the administration of intravenous contrast. COMPARISON: CTA head and neck same day CLINICAL HISTORY: Intermittent vertigo, elevated BPs. Pt arrives via [facility] from home for a near syncope episode. FINDINGS: BRAIN AND VENTRICLES: No acute infarct. No intracranial hemorrhage. No mass. No midline shift. No hydrocephalus. The sella is unremarkable. Normal flow voids. ORBITS: No acute abnormality. SINUSES AND MASTOIDS: No acute abnormality. BONES AND SOFT TISSUES: Normal marrow signal. No acute soft tissue abnormality. IMPRESSION: 1. No acute intracranial abnormality. Electronically signed by: Gilmore Molt MD 04/24/2024 08:00 PM EDT RP Workstation: [id number]   CT ANGIO HEAD NECK W WO CM Result Date: 04/24/2024 EXAM: CT HEAD WITHOUT CTA HEAD AND NECK WITH AND WITHOUT 04/24/2024 05:58:37 PM TECHNIQUE: CTA of the head and neck was performed with and without the  administration of intravenous contrast. Noncontrast CT of the head with reconstructed 2-D images are also provided for review. Multiplanar 2D and/or 3D reformatted images are provided for review. Automated exposure control, iterative reconstruction, and/or weight based adjustment of the mA/kV was utilized to reduce the radiation dose to as low as reasonably achievable. COMPARISON: CT head without contrast 08/25/2007. CLINICAL HISTORY: FINDINGS: CT HEAD: BRAIN AND VENTRICLES: No acute intracranial hemorrhage. No mass effect or midline shift. No extra-axial fluid collection. Gray-white differentiation is maintained. No hydrocephalus. ORBITS: No acute abnormality. SINUSES: No acute abnormality. SOFT TISSUES AND SKULL: No acute abnormality. CTA NECK: AORTIC ARCH AND ARCH VESSELS: Common origin of the left common carotid artery and the innominate artery is noted. Atherosclerotic changes are present in the distal aortic arch. No aneurysm or stenosis is present. No dissection or arterial injury. No significant stenosis of the brachiocephalic or subclavian arteries. CERVICAL CAROTID ARTERIES: Atherosclerotic changes are at the left carotid bifurcation proximal ICA. The minimal luminal diameter is 2.1 mm. As compared to distal left ICA lumen of 3.7 mm. This results in 45% stenosis of the left internal carotid artery at the bifurcation by NASCET criteria. No dissection, arterial injury, or hemodynamically significant stenosis by NASCET criteria. CERVICAL VERTEBRAL ARTERIES: Vertebral artery is slightly dominant. No dissection, arterial injury, or significant stenosis. VISUALIZED LUNGS AND MEDIASTINUM: Unremarkable. SOFT TISSUES: No acute abnormality. BONES: No acute abnormality. CTA HEAD: ANTERIOR CIRCULATION: No significant stenosis of the internal carotid arteries. Mild narrowing is present in the proximal left A1 segment. Moderate stenosis is present in the distal right M1 segment. No aneurysm. POSTERIOR CIRCULATION: Mild  narrowing is present in the left V4 segment. High-grade stenosis and occlusion is present in the distal left posterior cerebral artery P3 segment. No significant stenosis of the basilar artery. No aneurysm. OTHER: No dural venous sinus thrombosis on this non-dedicated study. IMPRESSION: 1. No acute intracranial abnormality. 2. 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 3. 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 4. 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 Electronically signed by: Lonni Necessary MD 04/24/2024 06:27 PM EDT RP Workstation: [id number]   DG Chest 2 View Result Date: 04/24/2024 CLINICAL DATA:  Shortness of breath. Near  syncope. Chest tightness. EXAM: CHEST - 2 VIEW COMPARISON:  01/02/2022 FINDINGS: The heart is mildly enlarged. Mediastinal contours are stable. No pulmonary edema. No focal airspace disease, pleural effusion, or pneumothorax. No acute osseous findings. IMPRESSION: Mild cardiomegaly. Electronically Signed   By: Andrea Gasman M.D.   On: 04/24/2024 18:26   Data Reviewed for HPI: Relevant notes from primary care and specialist visits, past discharge summaries as available in EHR, including Care Everywhere. 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% Patient presenting with episodes of transient vertigo x 2, feeling to pass out Stroke alert in the ED: Not a tPA or thrombectomy candidate due to NIH 0 last seen by teleneurology in the ED Permissive hypertension for  first 24-48 hrs post stroke onset: Prn Labetalol IV or Vasotec IV If BP greater than 220/120  Statins for LDL goal less than 70 ASA 81mg  daily, Plavix 75mg  daily x 3 weeks then monotherapy thereafter Telemetry, echocardiogram Avoid dextrose containing fluids, Maintain euglycemia, euthermia Neuro checks q4 hrs x 24 hrs and then per shift Head of bed 30 degrees Physical therapy/Occupational therapy/Speech therapy if failed dysphagia screen Can consider neurology inpatient consult Meclizine as needed for vertigo   Postural dizziness with presyncope Patient with an episode of low sternal, epigastric discomfort, shortness of breath and palpitations while in the ED that occurred on standing from commode, lasting about 5 minutes Chest x-ray with mild cardiomegaly Continuous cardiac monitoring to evaluate for arrhythmias Close troponin negative, will get a second Patient very active at bedside so low suspicion for PE Low suspicion for acute aortic syndrome, given main symptom of dizziness and palpitations and not chest or back pain Close hemodynamic monitoring Echocardiogram  Hypertensive urgency Holding antihypertensives unless SBP over 220 to allow for permissive hypertension  Pancytopenia (HCC) Likely secondary to chronic liver disease Stable  Hepatic 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 (HCC) Portal hypertension No acute issues suspected Patient is followed at the [facility] Continue home meds  Discoid lupus Diagnosed in the 70s-affected scalp No acute issues Not on relevant meds     DVT prophylaxis: Lovenox  Consults: none  Advance Care Planning: full code  Family Communication: Daughter at bedside  Disposition Plan: Back to previous home environment  Severity of Illness: The appropriate patient status for this patient is OBSERVATION. Observation status is judged to be reasonable and necessary in order to provide the required intensity of service to  ensure the patient's safety. The patient's presenting symptoms, physical exam findings, and initial radiographic and laboratory data in the context of their medical condition is felt to place them at decreased risk for further clinical deterioration. Furthermore, it is anticipated that the patient will be medically stable for discharge from the hospital within 2 midnights of admission.   Author: Delayne LULLA Solian, MD 04/24/2024 8:28 PM  For on call review [email or web address].

## 2024-04-24 NOTE — Consult Note (Signed)
 [facility] Consult Services   Patient Name:   Alexus, Galka Date of Birth:   March 10, 1956 Identification Number:   MRN - [id number] Date of Service:   04/24/2024 18:44:29  Diagnosis:       R42 - Dizziness/ Vertigo/ Giddiness  Impression:      Dizziness, resolved  H/o primary biliary cholangitis  H/o pulmonary hypertension    Patient states that she was outside feeding her chicken when she had sudden onset dizziness, as if she were going to pass out. She managed to crawl back into the house and called 911. She thinks her symptoms are mostly resolved by now. NIHSS is 0 at this time. With walking, gait is largely within normal limits however she complained of feeling mildly dizzy; still this is much improved compared to when the symptoms first began.    I briefly explained acute thrombolytic therapy with her, and explained to her that send her symptoms are mostly resolved and there are no focal deficits, but she is not eligible for the medication. I reviewed the CTA head and neck which shows that she does have about 45% stenosis of the left carotid however there is concern for occlusion of the left P3. I again explained to her with pictures on my phone what this means and that typically with this finding, we would have to reach out to neurointervention to discuss whether patient would be eligible for thrombectomy. However, given that her exam is within normal limits, she would not be a candidate for this procedure either. I let her know that I will discuss this with the ER attending and if need be, we will reach out to neurointervention.    Given other comorbidities, and findings on the CTA of left P3 occlusion, I do recommend further evaluation with an MRI brain as well as an echocardiogram. She is agreeable she is reportedly not allowed to take antiplatelet agents, she will run this by her hepatologist if needed.    Discussed the above findings with ER attending  Dr. Nicholaus, we both agreed that since the patient was currently asymptomatic at this time and NIHSS was 0 that she would likely not be eligible for thrombectomy.    PLAN:  - Admit to stroke floor on equivalent with q2 neurochecks  - Permissive hypertension up to 160-180/80-90 UNLESS contraindicated due to comorbidities or concern for endorgan damage; if so, maintain at BP where pt has no symptoms  - MRI brain without contrast  - Echocardiogram  - Antiplatelet therapy is recommended however patient is not sure whether she is allowed to take this given her liver issues, will need to run this with her hepatologist  - PT/OT  - Other management as per primary team    Discussed with ER attending Dr. Nicholaus.      Our recommendations are outlined below.  Recommendations:        Stroke/Telemetry Floor       Neuro Checks       Bedside Swallow Eval       DVT Prophylaxis       IV Fluids, Normal Saline       Euglycemia and Avoid Hyperthermia (PRN Acetaminophen)  Sign Out:       Discussed with Emergency Department Provider    ------------------------------------------------------------------------------  Advanced Imaging: CTA Head and Neck Completed.  LVO:Yes  Discussed with NIR :No   Metrics: Last Known Well: 04/24/2024 16:45:00 Dispatch Time: 04/24/2024 18:44:28 Arrival Time: 04/24/2024 16:03:00 Initial Response Time: 04/24/2024 18:46:22 Symptoms:  Dizziness. Initial patient interaction: 04/24/2024 18:52:03 NIHSS Assessment Completed: 04/24/2024 19:04:41 Patient is not a candidate for Thrombolytic. Thrombolytic Medical Decision: 04/24/2024 19:04:41 Patient was not deemed candidate for Thrombolytic because of following reasons: Resolved symptoms .  CT Head: I personally reviewed all the CT images that were available to me and it showed: no acute findings  Primary Provider Notified of Diagnostic Impression and Management Plan on: 04/24/2024  19:19:53    ------------------------------------------------------------------------------  History of Present Illness: Patient is a 68 year old Female.  Patient was brought by EMS for symptoms of Dizziness. Patient states that she was at home feeding the chicken in the backyard and all of a sudden she began feeling intensely dizzy, so much that she was afraid she was going to pass out so she held onto the chicken coop and managed to crawl out of the backyard to the side of the house, then picked up the home phone and called EMS. She confirms that she did not lose previously consciousness during this time. She has never had such severe dizziness previously.  She does mention a history of primary biliary cholangitis as well as pulmonary hypertension for which she is on ursodiol as well as Livdelzi; but doses have not been changed recently. She also appears to be severely hypertensive and states that she has only had occasional blood pressures that run high in the 200s but she typically runs in the 130s SBP. She denies any recent illnesses.    Past Medical History:      There is no history of Hypertension      There is no history of Diabetes Mellitus      There is no history of Hyperlipidemia Other PMH:  Primary biliary cholangitis, pulmonary HTN  Medications:  No Anticoagulant use  No Antiplatelet use Reviewed EMR for current medications Other Medications Pertinent To Assessment Include: Livdelzi, ursodiol  Allergies:  Reviewed  Social History: Smoking: No Alcohol Use: No  Family History:  There is no family history of premature cerebrovascular disease pertinent to this consultation  ROS : 14 Points Review of Systems was performed and was negative except mentioned in HPI.  Past Surgical History: There Is No Surgical History Contributory To Today's Visit     Examination: BP(197/115), Pulse(80), 1A: Level of Consciousness - Alert; keenly responsive + 0 1B: Ask Month and  Age - Both Questions Right + 0 1C: Blink Eyes & Squeeze Hands - Performs Both Tasks + 0 2: Test Horizontal Extraocular Movements - Normal + 0 3: Test Visual Fields - No Visual Loss + 0 4: Test Facial Palsy (Use Grimace if Obtunded) - Normal symmetry + 0 5A: Test Left Arm Motor Drift - No Drift for 10 Seconds + 0 5B: Test Right Arm Motor Drift - No Drift for 10 Seconds + 0 6A: Test Left Leg Motor Drift - No Drift for 5 Seconds + 0 6B: Test Right Leg Motor Drift - No Drift for 5 Seconds + 0 7: Test Limb Ataxia (FNF/Heel-Shin) - No Ataxia + 0 8: Test Sensation - Normal; No sensory loss + 0 9: Test Language/Aphasia - Normal; No aphasia + 0 10: Test Dysarthria - Normal + 0 11: Test Extinction/Inattention - No abnormality + 0  NIHSS Score: 0  NIHSS Free Text : gait: normal stance and stride length  Pre-Morbid Modified Rankin Scale: 0 Points = No symptoms at all  Spoke with : Dr. Nicholaus I reviewed the available imaging via Rapid and initiated discussion with the primary  provider  This consult was conducted in real time using interactive audio and immunologist. Patient was informed of the technology being used for this visit and agreed to proceed. Patient located in hospital and provider located at home/office setting.   Patient is being evaluated for possible acute neurologic impairment and high probability of imminent or life-threatening deterioration. I spent total of 45 minutes providing care to this patient, including time for face to face visit via telemedicine, review of medical records, imaging studies and discussion of findings with providers, the patient and/or family.    Dr Arva Neri   [facility] For Inpatient follow-up with [facility] physician please call [facility] at [phone]. As we are not an outpatient service for any post hospital discharge needs please contact the hospital for assistance. If you have any questions for the [facility] physicians or  need to reconsult for clinical or diagnostic changes please contact us  via [facility] at [phone].  Non-radiologist review of imaging performed to assist with emergent clinical decision-making. Remote physician workstations do not possess the same resolution, calibration, or diagnostic capabilities as hospital-based radiology reading stations, and formal radiologist read is necessary.   Signature : Arva Neri

## 2024-04-24 NOTE — ED Notes (Addendum)
Pt in MRI at this moment

## 2024-04-24 NOTE — Assessment & Plan Note (Addendum)
 Diagnosed in the 70s-affected scalp No acute issues Not on relevant meds

## 2024-04-25 ENCOUNTER — Observation Stay (HOSPITAL_BASED_OUTPATIENT_CLINIC_OR_DEPARTMENT_OTHER)
Admit: 2024-04-25 | Discharge: 2024-04-25 | Disposition: A | Discharge status: Home / Self Care | Attending: Internal Medicine | Admitting: Internal Medicine

## 2024-04-25 DIAGNOSIS — Z7982 Long term (current) use of aspirin: Secondary | ICD-10-CM | POA: Diagnosis not present

## 2024-04-25 DIAGNOSIS — R55 Syncope and collapse: Secondary | ICD-10-CM

## 2024-04-25 DIAGNOSIS — R42 Dizziness and giddiness: Secondary | ICD-10-CM | POA: Diagnosis not present

## 2024-04-25 DIAGNOSIS — G459 Transient cerebral ischemic attack, unspecified: Secondary | ICD-10-CM | POA: Diagnosis not present

## 2024-04-25 DIAGNOSIS — R7989 Other specified abnormal findings of blood chemistry: Secondary | ICD-10-CM

## 2024-04-25 LAB — LIPID PANEL
Cholesterol: 222 mg/dL — ABNORMAL HIGH (ref 0–200)
HDL: 71 mg/dL (ref >40)
LDL Cholesterol: 137 mg/dL — ABNORMAL HIGH (ref 0–99)
Total CHOL/HDL Ratio: 3.1 ratio
Triglycerides: 72 mg/dL (ref <150)
VLDL: 14 mg/dL (ref 0–40)

## 2024-04-25 LAB — ECHOCARDIOGRAM COMPLETE
AR max vel: 1.79 cm2
AV Area VTI: 1.68 cm2
AV Area mean vel: 1.68 cm2
AV Mean grad: 3 mmHg
AV Peak grad: 5.4 mmHg
Ao pk vel: 1.16 m/s
Area-P 1/2: 3.72 cm2
Calc EF: 61.4 %
Height: 62 in
MV M vel: 4.62 m/s
MV Peak grad: 85.4 mmHg
MV VTI: 1.58 cm2
Radius: 0.5 cm
S' Lateral: 2.9 cm
Single Plane A2C EF: 65.1 %
Single Plane A4C EF: 57.9 %
Weight: 2070.56 [oz_av]

## 2024-04-25 LAB — HIV ANTIBODY (ROUTINE TESTING W REFLEX): HIV Screen 4th Generation wRfx: NONREACTIVE

## 2024-04-25 LAB — TROPONIN I (HIGH SENSITIVITY): Troponin I (High Sensitivity): 8 ng/L (ref <18)

## 2024-04-25 LAB — HEMOGLOBIN A1C
Hgb A1c MFr Bld: 5.5 % (ref 4.8–5.6)
Mean Plasma Glucose: 111.15 mg/dL

## 2024-04-25 MED ORDER — CLOPIDOGREL BISULFATE 75 MG PO TABS: 75.0000 mg | ORAL_TABLET | Freq: Every day | ORAL | 0 refills | Status: AC

## 2024-04-25 MED ORDER — ATORVASTATIN CALCIUM 40 MG PO TABS: 40.0000 mg | ORAL_TABLET | Freq: Every day | ORAL | 2 refills | Status: AC

## 2024-04-25 MED ORDER — ASPIRIN 81 MG PO CHEW: 81.0000 mg | CHEWABLE_TABLET | Freq: Every day | ORAL | 3 refills | Status: DC

## 2024-04-25 MED ORDER — ATORVASTATIN CALCIUM 20 MG PO TABS
40.0000 mg | ORAL_TABLET | Freq: Every day | ORAL | Status: DC
  Administered 2024-04-25: 40 mg via ORAL
  Filled 2024-04-25: qty 2

## 2024-04-25 NOTE — Progress Notes (Signed)
 Patient admitted from home to workup for TIA and pre-syncope found to have high-grade stenosis/occlusion in the distal left PCA and a 45% stenosis left proximal internal carotid artery. Patient is not a candidate for TPA. Therapy recs are pending. Please outreach to to [facility] if needs are identified.

## 2024-04-25 NOTE — Progress Notes (Signed)
   04/25/24 0700  Spiritual Encounters  Type of Visit Initial  Care provided to: Pt not available  Conversation partners present during encounter Nurse  Referral source Code page  Reason for visit Code  OnCall Visit Yes   Chaplain responded to code page. Chaplain arrived and patient being transferred to unit. No family present. Chaplain services is available for follow up as needed.

## 2024-04-25 NOTE — Progress Notes (Signed)
 SLP Cancellation Note  Patient Details Name: Valerie Rojas MRN: [id number] DOB: [birth year]   Cancelled treatment:       Reason Eval/Treat Not Completed: SLP screened, no needs identified, will sign off. Pt alert and oriented, denying acute cognitive concerns- reporting at baseline status. No acute SLP services indicated.   [name], MS, CCC-SLP Speech Language Pathologist Rehab Services; [facility] [phone] (ascom)     [name] 04/25/2024, 10:56 AM

## 2024-04-25 NOTE — Evaluation (Signed)
 Occupational Therapy Evaluation Patient Details Name: Valerie Rojas MRN: [id number] DOB: [birth year] Today's Date: 04/25/2024   History of Present Illness   Pt is a 68 y.o. female being admitted primarily for a TIA and presyncope workup. While awaiting workup in the ED, stroke alert was activated after patient had an episode of sudden onset shortness of breath, palpitations and low sternal/epigastric tightness after standing up from [facility] which resolved upon laying down. 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 MRI negative for stroke. PMH of Biliary cirrhosis with portal hypertension followed at [facility], discoid lupus, chronic pancytopenia, HTN     Clinical Impressions Pt was seen for OT evaluation this date. PTA, pt resides at home alone and is independent with all tasks. Pt is currently independent with all mobility, transfers and ADLs. Pt's strength, coordination, vision, balance and cognition are all intact. No further dizzy or near passing out spells have occurred. She has no acute need for OT services and no follow up OT indicated. Will sign off in house.     If plan is discharge home, recommend the following:         Functional Status Assessment   Patient has not had a recent decline in their functional status     Equipment Recommendations         Recommendations for Other Services         Precautions/Restrictions   Precautions Precautions: None Recall of Precautions/Restrictions: Intact Restrictions Weight Bearing Restrictions Per Provider Order: No     Mobility Bed Mobility Overal bed mobility: Independent                  Transfers Overall transfer level: Independent                        Balance Overall balance assessment: Independent                                         ADL either performed or assessed with  clinical judgement   ADL Overall ADL's : Independent                                             Vision Patient Visual Report: No change from baseline       Perception         Praxis         Pertinent Vitals/Pain Pain Assessment Pain Assessment: No/denies pain     Extremity/Trunk Assessment Upper Extremity Assessment Upper Extremity Assessment: Overall WFL for tasks assessed   Lower Extremity Assessment Lower Extremity Assessment: Overall WFL for tasks assessed       Communication Communication Communication: No apparent difficulties   Cognition Arousal: Alert Behavior During Therapy: WFL for tasks assessed/performed Cognition: No apparent impairments                               Following commands: Intact       Cueing  General Comments      HR up to 103 after ambulation   Exercises Other Exercises Other Exercises: Edu on role of OT and pacing/slowing down turning transitional movements to  prevent LOB or dizziness. Pt verbalized understanding.   Shoulder Instructions      Home Living Family/patient expects to be discharged to:: Private residence Living Arrangements: Alone Available Help at Discharge: Friend(s);Neighbor Type of Home: House Home Access: Stairs to enter Secretary/administrator of Steps: 2 Entrance Stairs-Rails: None                            Prior Functioning/Environment Prior Level of Function : Independent/Modified Independent;Driving             Mobility Comments: no AD use, gardens, drives, feeds chickens      OT Problem List:     OT Treatment/Interventions:        OT Goals(Current goals can be found in the care plan section)       OT Frequency:       Co-evaluation              AM-PAC OT 6 Clicks Daily Activity     Outcome Measure Help from another person eating meals?: None Help from another person taking care of personal grooming?: None Help from  another person toileting, which includes using toliet, bedpan, or urinal?: None Help from another person bathing (including washing, rinsing, drying)?: None Help from another person to put on and taking off regular upper body clothing?: None Help from another person to put on and taking off regular lower body clothing?: None 6 Click Score: 24   End of Session Equipment Utilized During Treatment: Gait belt Nurse Communication: Mobility status  Activity Tolerance: Patient tolerated treatment well Patient left: in chair;with call bell/phone within reach  OT Visit Diagnosis: Other abnormalities of gait and mobility (R26.89)                Time: 9181-9169 OT Time Calculation (min): 12 min Charges:  OT General Charges $OT Visit: 1 Visit OT Evaluation $OT Eval Low Complexity: 1 Low Beckham Buxbaum, OTR/L 04/25/24, 8:36 AM  Valerie Rojas 04/25/2024, 8:35 AM

## 2024-04-25 NOTE — Progress Notes (Signed)
 PT Cancellation Note  Patient Details Name: Valerie Rojas MRN: [id number] DOB: [birth year]   Cancelled Treatment:    Reason Eval/Treat Not Completed: PT screened, no needs identified, will sign off (Alerted by OT that patient is independent with mobility. No apparent acute PT needs at this time.)  Randine Essex, PT, MPT  [name] 04/25/2024, 9:35 AM

## 2024-04-25 NOTE — Plan of Care (Signed)
  Problem: Ischemic Stroke/TIA Tissue Perfusion: Goal: Complications of ischemic stroke/TIA will be minimized Outcome: Progressing   Problem: Coping: Goal: Will verbalize positive feelings about self Outcome: Progressing   Problem: Self-Care: Goal: Ability to participate in self-care as condition permits will improve Outcome: Progressing   Problem: Clinical Measurements: Goal: Ability to maintain clinical measurements within normal limits will improve Outcome: Progressing   Problem: Activity: Goal: Risk for activity intolerance will decrease Outcome: Progressing   Problem: Pain Managment: Goal: General experience of comfort will improve and/or be controlled Outcome: Progressing   Problem: Safety: Goal: Ability to remain free from injury will improve Outcome: Progressing

## 2024-04-25 NOTE — Consult Note (Signed)
 Reason for Consult:Dizziness Requesting Physician: Djan  CC: Dizziness  I have been asked by Dr. Dorinda to see this patient in consultation to rule out stroke.  HPI: Valerie Rojas is an 68 y.o. female  with medical history significant for Biliary cirrhosis with portal hypertension followed at [facility], discoid lupus, chronic pancytopenia, HTN admitted with dizziness to rule out TIA.  Patient was out feeding her chickens when she experienced sudden onset dizziness and nausea, feeling like the place was spinning.  She denied chest or back pain, shortness of breath or palpitations.  She felt like she was going to pass out and lowered herself to the ground crawling back to the house where she called 911.  She did not fall and did not lose consciousness.  She states her blood pressure was in the 200s with EMS.  Today patient feels back to baseline.  Past Medical History:  Diagnosis Date   Anemia    pt reports a while back   Discoid lupus    Hyperlipidemia    high level on meds to prevent getting worse   Hypertension    Liver disease    Other specified disorders of liver    In process of dx issues with liver/bile duct    Past Surgical History:  Procedure Laterality Date   COLONOSCOPY  2011-[facility]   LIVER BIOPSY      Family History  Problem Relation Age of Onset   Heart disease Mother    Stroke Mother    Diabetes Father    Diabetes Brother    Colon cancer Paternal Aunt        14's at dx   Heart disease Maternal Grandmother    Diabetes Paternal Grandmother    Esophageal cancer Neg Hx    Rectal cancer Neg Hx    Stomach cancer Neg Hx    Colon polyps Neg Hx    Pancreatic cancer Neg Hx     Social History:  reports that she has never smoked. She has never used smokeless tobacco. She reports that she does not drink alcohol and does not use drugs.  No Known Allergies  Medications:  Prior to Admission medications   Medication Sig Start Date End Date Taking? Authorizing  Provider  carvedilol (COREG) 3.125 MG tablet Take 3.125 mg by mouth 2 (two) times daily with a meal. 12/07/23 12/06/24 Yes [provider]  cholecalciferol (VITAMIN D3) 25 MCG (1000 UNIT) tablet Take 1,000 Units by mouth daily.   Yes [provider]  LIVDELZI 10 MG CAPS Take 1 capsule by mouth daily.   Yes [provider]  ursodiol (ACTIGALL) 250 MG tablet Take 250 mg by mouth 2 (two) times daily. 2 tablets in the morning and 1 tablet in the evening 02/03/21  Yes [provider]  vitamin B-12 (CYANOCOBALAMIN) 100 MCG tablet Take 100 mcg by mouth daily.   Yes [provider]  aspirin 81 MG chewable tablet Chew 1 tablet (81 mg total) by mouth daily. 04/26/24   Dorinda Drue DASEN, MD  atorvastatin (LIPITOR) 40 MG tablet Take 1 tablet (40 mg total) by mouth daily. 04/25/24   Dorinda Drue DASEN, MD  clopidogrel (PLAVIX) 75 MG tablet Take 1 tablet (75 mg total) by mouth daily for 21 days. 04/26/24 05/17/24  Dorinda Drue DASEN, MD     ROS: History obtained from the patient  General ROS: negative for - chills, fatigue, fever, night sweats, weight gain or weight loss Psychological ROS: negative for -  behavioral disorder, hallucinations, memory difficulties, mood swings or suicidal ideation Ophthalmic ROS: negative for - blurry vision, double vision, eye pain or loss of vision ENT ROS: negative for - epistaxis, nasal discharge, oral lesions, sore throat, tinnitus or vertigo Allergy and Immunology ROS: negative for - hives or itchy/watery eyes Hematological and Lymphatic ROS: negative for - bleeding problems, bruising or swollen lymph nodes Endocrine ROS: negative for - galactorrhea, hair pattern changes, polydipsia/polyuria or temperature intolerance Respiratory ROS: negative for - cough, hemoptysis, shortness of breath or wheezing Cardiovascular ROS: negative for - chest pain, dyspnea on exertion, edema or irregular heartbeat Gastrointestinal ROS: negative for - abdominal  pain, diarrhea, hematemesis, nausea/vomiting or stool incontinence Genito-Urinary ROS: negative for - dysuria, hematuria, incontinence or urinary frequency/urgency Musculoskeletal ROS: negative for - joint swelling or muscular weakness Neurological ROS: as noted in HPI Dermatological ROS: negative for rash and skin lesion changes   Physical Examination: Blood pressure (!) 150/63, pulse 75, temperature 98 F (36.7 C), temperature source Oral, resp. rate 16, height 5' 2 (1.575 m), weight 58.7 kg, SpO2 98%.  HEENT-  Normocephalic, no lesions, without obvious abnormality.  Normal external eye and conjunctiva.  Normal TM's bilaterally.  Normal auditory canals and external ears. Normal external nose, mucus membranes and septum.  Normal pharynx. Cardiovascular- Single S1, S2, pulses palpable throughout   Lungs- CTA Abdomen- soft, nontender Extremities- no edema, no skin discoloration, and no clubbing Musculoskeletal-no joint tenderness, deformity or swelling Skin-warm and dry, no hyperpigmentation, vitiligo, or suspicious lesions  Neurological Examination   Mental Status: Alert, oriented, thought content appropriate.  Speech fluent without evidence of aphasia.  Able to follow 3 step commands without difficulty. Cranial Nerves: II: Visual fields grossly normal, pupils equal, round, reactive to light and accommodation III,IV, VI: ptosis not present, extra-ocular motions intact bilaterally V,VII: smile symmetric, facial light touch sensation normal bilaterally VIII: hearing normal bilaterally IX,X: gag reflex present XI: bilateral shoulder shrug XII: midline tongue extension Motor: Right : Upper extremity   5/5    Left:     Upper extremity   5/5  Lower extremity   5/5     Lower extremity   5/5 Tone and bulk:normal tone throughout; no atrophy noted Sensory: Pinprick and light touch intact throughout, bilaterally Deep Tendon Reflexes: 2+ with absent right AJ Plantars: Right:  equivocal   Left: equivocal Cerebellar: normal finger-to-nose and normal heel-to-shin testing bilaterally Gait: deferred due to safety concerns      Laboratory Studies:   Basic Metabolic Panel: Recent Labs  Lab 04/24/24 1610  NA 142  K 3.6  CL 104  CO2 25  GLUCOSE 105*  BUN 18  CREATININE 0.99  CALCIUM 9.6    Liver Function Tests: Recent Labs  Lab 04/24/24 1610  AST 50*  ALT 32  ALKPHOS 275*  BILITOT 1.3*  PROT 8.4*  ALBUMIN 3.8   Recent Labs  Lab 04/24/24 1610  LIPASE 30   No results for input(s): AMMONIA in the last 168 hours.  CBC: Recent Labs  Lab 04/24/24 1610  WBC 3.8*  HGB 9.9*  HCT 31.8*  MCV 70.5*  PLT 104*    Cardiac Enzymes: No results for input(s): CKTOTAL, CKMB, CKMBINDEX, TROPONINI in the last 168 hours.  BNP: Invalid input(s): POCBNP  CBG: Recent Labs  Lab 04/24/24 1841  GLUCAP 100*    Microbiology: No results found for this or any previous visit.  Coagulation Studies: No results for input(s): LABPROT, INR in the last 72 hours.  Urinalysis:  Recent Labs  Lab 04/24/24 1626  COLORURINE YELLOW*  LABSPEC 1.008  PHURINE 7.0  GLUCOSEU NEGATIVE  HGBUR NEGATIVE  BILIRUBINUR NEGATIVE  KETONESUR NEGATIVE  PROTEINUR NEGATIVE  NITRITE NEGATIVE  LEUKOCYTESUR NEGATIVE    Lipid Panel:     Component Value Date/Time   CHOL 222 (H) 04/25/2024 0254   TRIG 72 04/25/2024 0254   HDL 71 04/25/2024 0254   CHOLHDL 3.1 04/25/2024 0254   VLDL 14 04/25/2024 0254   LDLCALC 137 (H) 04/25/2024 0254    HgbA1C:  Lab Results  Component Value Date   HGBA1C 5.5 04/25/2024    Urine Drug Screen:  No results found for: LABOPIA, COCAINSCRNUR, LABBENZ, AMPHETMU, THCU, LABBARB  Alcohol Level: No results for input(s): ETH in the last 168 hours.   Imaging: ECHOCARDIOGRAM COMPLETE Result Date: 04/25/2024    ECHOCARDIOGRAM REPORT   Patient Name:   ADAMARIZ GILLOTT Date of Exam: 04/25/2024 Medical Rec #:   [id number]      Height:       62.0 in Accession #:    [id number]     Weight:       129.4 lb Date of Birth:  [birth year]     BSA:          1.589 m Patient Age:    67 years       BP:           144/63 mmHg Patient Gender: F              HR:           65 bpm. Exam Location:  [facility] Procedure: 2D Echo, Cardiac Doppler and Color Doppler (Both Spectral and Color            Flow Doppler were utilized during procedure). Indications:     Stroke I63.9  History:         Patient has no prior history of Echocardiogram examinations.                  Stroke.  Sonographer:     Rosina Dunk Referring Phys:  [id number] DELAYNE LULLA SOLIAN Diagnosing Phys: Deatrice Cage MD  Sonographer Comments: Image acquisition challenging due to respiratory motion. IMPRESSIONS  1. Left ventricular ejection fraction, by estimation, is 60 to 65%. The left ventricle has normal function. The left ventricle has no regional wall motion abnormalities. Left ventricular diastolic parameters were normal.  2. Right ventricular systolic function is normal. The right ventricular size is normal. Tricuspid regurgitation signal is inadequate for assessing PA pressure.  3. The mitral valve is normal in structure. Mild to moderate mitral valve regurgitation. No evidence of mitral stenosis.  4. The aortic valve is normal in structure. Aortic valve regurgitation is not visualized. Aortic valve sclerosis is present, with no evidence of aortic valve stenosis.  5. The inferior vena cava is normal in size with greater than 50% respiratory variability, suggesting right atrial pressure of 3 mmHg. FINDINGS  Left Ventricle: Left ventricular ejection fraction, by estimation, is 60 to 65%. The left ventricle has normal function. The left ventricle has no regional wall motion abnormalities. The left ventricular internal cavity size was normal in size. There is  no left ventricular hypertrophy. Left ventricular diastolic parameters were normal. Right Ventricle: The right  ventricular size is normal. No increase in right ventricular wall thickness. Right ventricular systolic function is normal. Tricuspid regurgitation signal is inadequate for assessing PA pressure. Left Atrium: Left atrial size was normal in size. Right Atrium:  Right atrial size was normal in size. Pericardium: There is no evidence of pericardial effusion. Mitral Valve: The mitral valve is normal in structure. Mild to moderate mitral valve regurgitation, with posteriorly-directed jet. No evidence of mitral valve stenosis. MV peak gradient, 4.6 mmHg. The mean mitral valve gradient is 2.0 mmHg. Tricuspid Valve: The tricuspid valve is normal in structure. Tricuspid valve regurgitation is not demonstrated. No evidence of tricuspid stenosis. Aortic Valve: The aortic valve is normal in structure. Aortic valve regurgitation is not visualized. Aortic valve sclerosis is present, with no evidence of aortic valve stenosis. Aortic valve mean gradient measures 3.0 mmHg. Aortic valve peak gradient measures 5.4 mmHg. Aortic valve area, by VTI measures 1.68 cm. Pulmonic Valve: The pulmonic valve was normal in structure. Pulmonic valve regurgitation is not visualized. No evidence of pulmonic stenosis. Aorta: The aortic root is normal in size and structure. Venous: The inferior vena cava is normal in size with greater than 50% respiratory variability, suggesting right atrial pressure of 3 mmHg. IAS/Shunts: No atrial level shunt detected by color flow Doppler.  LEFT VENTRICLE PLAX 2D LVIDd:         4.20 cm     Diastology LVIDs:         2.90 cm     LV e' medial:    8.16 cm/s LV PW:         0.90 cm     LV E/e' medial:  11.4 LV IVS:        0.90 cm     LV e' lateral:   9.03 cm/s LVOT diam:     1.70 cm     LV E/e' lateral: 10.3 LV SV:         48 LV SV Index:   30 LVOT Area:     2.27 cm  LV Volumes (MOD) LV vol d, MOD A2C: 59.3 ml LV vol d, MOD A4C: 67.2 ml LV vol s, MOD A2C: 20.7 ml LV vol s, MOD A4C: 28.3 ml LV SV MOD A2C:     38.6 ml LV  SV MOD A4C:     67.2 ml LV SV MOD BP:      39.7 ml RIGHT VENTRICLE             IVC RV Basal diam:  3.00 cm     IVC diam: 1.40 cm RV Mid diam:    2.10 cm RV S prime:     10.70 cm/s TAPSE (M-mode): 2.2 cm LEFT ATRIUM             Index        RIGHT ATRIUM          Index LA diam:        3.50 cm 2.20 cm/m   RA Area:     9.39 cm LA Vol (A2C):   75.5 ml 47.52 ml/m  RA Volume:   15.50 ml 9.76 ml/m LA Vol (A4C):   47.2 ml 29.71 ml/m LA Biplane Vol: 62.4 ml 39.28 ml/m  AORTIC VALVE                    PULMONIC VALVE AV Area (Vmax):    1.79 cm     PV Vmax:        0.90 m/s AV Area (Vmean):   1.68 cm     PV Vmean:       66.900 cm/s AV Area (VTI):     1.68 cm     PV VTI:  0.216 m AV Vmax:           116.00 cm/s  PV Peak grad:   3.3 mmHg AV Vmean:          81.400 cm/s  PV Mean grad:   2.0 mmHg AV VTI:            0.283 m      RVOT Peak grad: 1 mmHg AV Peak Grad:      5.4 mmHg AV Mean Grad:      3.0 mmHg LVOT Vmax:         91.70 cm/s LVOT Vmean:        60.400 cm/s LVOT VTI:          0.210 m LVOT/AV VTI ratio: 0.74  AORTA Ao Root diam: 2.60 cm MITRAL VALVE MV Area (PHT): 3.72 cm       SHUNTS MV Area VTI:   1.58 cm       Systemic VTI:  0.21 m MV Peak grad:  4.6 mmHg       Systemic Diam: 1.70 cm MV Mean grad:  2.0 mmHg       Pulmonic VTI:  0.109 m MV Vmax:       1.07 m/s MV Vmean:      73.7 cm/s MV Decel Time: 204 msec MR Peak grad:    85.4 mmHg MR Vmax:         462.00 cm/s MR PISA:         1.57 cm MR PISA Eff ROA: 13 mm MR PISA Radius:  0.50 cm MV E velocity: 93.40 cm/s MV A velocity: 69.40 cm/s MV E/A ratio:  1.35 Deatrice Cage MD Electronically signed by Deatrice Cage MD Signature Date/Time: 04/25/2024/9:15:40 AM    Final    MR BRAIN WO CONTRAST Result Date: 04/24/2024 EXAM: MRI BRAIN WITHOUT CONTRAST 04/24/2024 07:55:58 PM TECHNIQUE: Multiplanar multisequence MRI of the head/brain was performed without the administration of intravenous contrast. COMPARISON: CTA head and neck same day CLINICAL HISTORY:  Intermittent vertigo, elevated BPs. Pt arrives via [facility] from home for a near syncope episode. FINDINGS: BRAIN AND VENTRICLES: No acute infarct. No intracranial hemorrhage. No mass. No midline shift. No hydrocephalus. The sella is unremarkable. Normal flow voids. ORBITS: No acute abnormality. SINUSES AND MASTOIDS: No acute abnormality. BONES AND SOFT TISSUES: Normal marrow signal. No acute soft tissue abnormality. IMPRESSION: 1. No acute intracranial abnormality. Electronically signed by: Gilmore Molt MD 04/24/2024 08:00 PM EDT RP Workstation: [id number]   CT ANGIO HEAD NECK W WO CM Result Date: 04/24/2024 EXAM: CT HEAD WITHOUT CTA HEAD AND NECK WITH AND WITHOUT 04/24/2024 05:58:37 PM TECHNIQUE: CTA of the head and neck was performed with and without the administration of intravenous contrast. Noncontrast CT of the head with reconstructed 2-D images are also provided for review. Multiplanar 2D and/or 3D reformatted images are provided for review. Automated exposure control, iterative reconstruction, and/or weight based adjustment of the mA/kV was utilized to reduce the radiation dose to as low as reasonably achievable. COMPARISON: CT head without contrast 08/25/2007. CLINICAL HISTORY: FINDINGS: CT HEAD: BRAIN AND VENTRICLES: No acute intracranial hemorrhage. No mass effect or midline shift. No extra-axial fluid collection. Gray-white differentiation is maintained. No hydrocephalus. ORBITS: No acute abnormality. SINUSES: No acute abnormality. SOFT TISSUES AND SKULL: No acute abnormality. CTA NECK: AORTIC ARCH AND ARCH VESSELS: Common origin of the left common carotid artery and the innominate artery is noted. Atherosclerotic changes are present in the distal aortic arch. No aneurysm or stenosis  is present. No dissection or arterial injury. No significant stenosis of the brachiocephalic or subclavian arteries. CERVICAL CAROTID ARTERIES: Atherosclerotic changes are at the left carotid bifurcation proximal ICA.  The minimal luminal diameter is 2.1 mm. As compared to distal left ICA lumen of 3.7 mm. This results in 45% stenosis of the left internal carotid artery at the bifurcation by NASCET criteria. No dissection, arterial injury, or hemodynamically significant stenosis by NASCET criteria. CERVICAL VERTEBRAL ARTERIES: Vertebral artery is slightly dominant. No dissection, arterial injury, or significant stenosis. VISUALIZED LUNGS AND MEDIASTINUM: Unremarkable. SOFT TISSUES: No acute abnormality. BONES: No acute abnormality. CTA HEAD: ANTERIOR CIRCULATION: No significant stenosis of the internal carotid arteries. Mild narrowing is present in the proximal left A1 segment. Moderate stenosis is present in the distal right M1 segment. No aneurysm. POSTERIOR CIRCULATION: Mild narrowing is present in the left V4 segment. High-grade stenosis and occlusion is present in the distal left posterior cerebral artery P3 segment. No significant stenosis of the basilar artery. No aneurysm. OTHER: No dural venous sinus thrombosis on this non-dedicated study. IMPRESSION: 1. No acute intracranial abnormality. 2. 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 3. 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 4. 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 Electronically signed by: Lonni Necessary MD 04/24/2024 06:27 PM EDT RP Workstation: [id number]   DG Chest 2 View Result Date: 04/24/2024 CLINICAL DATA:  Shortness of breath. Near syncope. Chest tightness. EXAM: CHEST - 2 VIEW COMPARISON:  01/02/2022 FINDINGS: The heart is mildly enlarged. Mediastinal contours are stable. No pulmonary edema. No focal airspace disease, pleural effusion, or pneumothorax. No acute osseous findings. IMPRESSION: Mild cardiomegaly. Electronically Signed   By: Andrea Gasman M.D.   On: 04/24/2024 18:26     Assessment/Plan: 68 year old female presenting with complaints of dizziness  and nausea.  Symptoms resolved spontaneously.  Patient reports that today she feels at baseline.  MRI of the brain personally reviewed and show no evidence of acute infarct. CTA of the head and neck shows a P3 occlusion.  Unclear if patient's symptoms represent a TIA since the differential for isolated dizziness is large.  Echocardiogram shows EF of 60-65% with no cardiac source of emboli identified.  A1c 5.5.  LDL 137.  Recommendations: Agree with initiation of ASA 81mg  daily.   Would wait to start lipid lowering agent until approved by her GI doctor due to elevated LFTs and known liver disease.   If continued episodes would have patient evaluated by ENT on an outpatient basis.    Sonny Hock, MD Neurology  04/25/2024, 11:22 AM

## 2024-04-25 NOTE — Discharge Summary (Signed)
 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 *  Hospital Course: TICARA WANER is a 68 y.o. female with medical history significant for Biliary cirrhosis with portal hypertension followed at [facility], discoid lupus, chronic pancytopenia, HTN, being admitted primarily for a TIA and presyncope workup.  Patient was out feeding her chickens when she experienced sudden onset dizziness and nausea, feeling like the place was spinning.  She denied chest or back pain, shortness of breath or palpitations.  She felt like she was going to pass out and lowered herself to the ground crawling back to the house where she called 911.  She did not fall and did not lose consciousness.  She states her blood pressure was in the 200s with EMS.   On arrival to the ED, symptoms had resolved but she stated she still felt a bit off. Vitals notable for BP 197-210, otherwise unremarkable.   While awaiting workup in the ED, stroke alert was activated after patient had an episode of sudden onset shortness of breath, palpitations and low sternal/epigastric tightness after standing up from the bedside commode.  She did not feel dizzy at the time.  Denied nausea or diaphoresis.  Symptoms again resolved after she returned to the stretcher.   NIH  score 0.   Stat CTA head and neck was nonacute but showed high-grade stenosis/occlusion in the distal left PCA and a 45% stenosis left proximal internal carotid artery. She was seen in consultation by teleneurology who did not consider her a candidate for thrombectomy or tPA due to NIH of 0.  They recommended MRI and admission for stroke workup.  Subsequent MRI brain with no acute intracranial abnormality. Additional ED workup notable for the following: LFTs at baseline for primary biliary cholangitis/cirrhosis CBC with baseline pancytopenia (WBC 3.8, hemoglobin 9.9, platelets 104) BMP, UA, troponin and BNP all within normal limits EKG showed sinus at 76 Chest x-ray mild cardiomegaly Patient was subsequently admitted for TIA workup. Echocardiogram reviewed that showed normal EF MRI of the brain reviewed that did not show any acute intracranial pathology.  Patient has been seen by neurologist and cleared for discharge today and will follow-up with her outpatient physicians.  Patient's LDL elevated and so prescribed statin therapy however she will discuss with her hepatologist before initiation.     Consultants: Neurology Procedures performed: None Disposition: Home Diet recommendation:  Cardiac diet DISCHARGE MEDICATION: Allergies as of 04/25/2024   No Known Allergies      Medication List     TAKE these medications    aspirin 81 MG chewable tablet Chew 1 tablet (81 mg total) by mouth daily. Start taking on: April 26, 2024   atorvastatin 40 MG tablet Commonly known as: LIPITOR Take 1 tablet (40 mg total) by mouth daily.   carvedilol 3.125 MG tablet Commonly known as: COREG Take  3.125 mg by mouth 2 (two) times daily with a meal.   cholecalciferol 25 MCG (1000 UNIT) tablet Commonly known as: VITAMIN D3 Take 1,000 Units by mouth daily.   clopidogrel 75 MG tablet Commonly known as: PLAVIX Take 1 tablet (75 mg total) by mouth daily for 21 days. Start taking on: April 26, 2024   Livdelzi 10 MG Caps Generic drug: Seladelpar Lysine Take 1 capsule by mouth daily.   ursodiol 250 MG tablet Commonly known as: ACTIGALL Take 250 mg by mouth 2 (two) times daily. 2 tablets in the morning and 1 tablet in the evening   vitamin B-12 100 MCG tablet Commonly known as: CYANOCOBALAMIN Take 100 mcg by mouth daily.        Follow-up Information     Vincente Shivers, NP Follow up.   Specialty: General Practice Why: hospital follow up Contact information: [address] [place] [address] [phone]                Discharge Exam: Fredricka Weights   04/24/24 1607 04/25/24 0024  Weight: 57.6 kg 58.7 kg   Vitals and nursing note reviewed.  Constitutional:      General: She is not in acute distress. HENT:     Head: Normocephalic and atraumatic.  Cardiovascular:     Rate and Rhythm: Normal rate and regular rhythm.     Heart sounds: Normal heart sounds.  Pulmonary:     Effort: Pulmonary effort is normal.     Breath sounds: Normal breath sounds.  Abdominal:     Palpations: Abdomen is soft.     Tenderness: There is no abdominal tenderness.  Neurological:     Mental Status: Mental status is at baseline.     Condition at discharge: good  The results of significant diagnostics from this hospitalization (including imaging, microbiology, ancillary and laboratory) are listed below for reference.   Imaging Studies: ECHOCARDIOGRAM COMPLETE Result Date: 04/25/2024    ECHOCARDIOGRAM REPORT   Patient Name:   Valerie Rojas Date of Exam: 04/25/2024 Medical Rec #:  [id number]      Height:       62.0 in Accession #:    [id number]     Weight:       129.4 lb Date of Birth:  [birth year]     BSA:          1.589 m Patient Age:    67 years       BP:           144/63 mmHg Patient Gender: F              HR:           65 bpm. Exam Location:  [facility] Procedure: 2D Echo, Cardiac Doppler and Color Doppler (Both Spectral and Color            Flow Doppler were utilized during  procedure). Indications:     Stroke I63.9  History:         Patient has no prior history of Echocardiogram examinations.                  Stroke.  Sonographer:     Rosina Dunk Referring Phys:  [id number] DELAYNE LULLA SOLIAN Diagnosing Phys: Deatrice Cage MD  Sonographer Comments: Image acquisition challenging due to respiratory motion. IMPRESSIONS  1. Left ventricular ejection fraction, by estimation, is 60 to 65%. The left ventricle has normal function. The left ventricle has no regional wall motion  abnormalities. Left ventricular diastolic parameters were normal.  2. Right ventricular systolic function is normal. The right ventricular size is normal. Tricuspid regurgitation signal is inadequate for assessing PA pressure.  3. The mitral valve is normal in structure. Mild to moderate mitral valve regurgitation. No evidence of mitral stenosis.  4. The aortic valve is normal in structure. Aortic valve regurgitation is not visualized. Aortic valve sclerosis is present, with no evidence of aortic valve stenosis.  5. The inferior vena cava is normal in size with greater than 50% respiratory variability, suggesting right atrial pressure of 3 mmHg. FINDINGS  Left Ventricle: Left ventricular ejection fraction, by estimation, is 60 to 65%. The left ventricle has normal function. The left ventricle has no regional wall motion abnormalities. The left ventricular internal cavity size was normal in size. There is  no left ventricular hypertrophy. Left ventricular diastolic parameters were normal. Right Ventricle: The right ventricular size is normal. No increase in right ventricular wall thickness. Right ventricular systolic function is normal. Tricuspid regurgitation signal is inadequate for assessing PA pressure. Left Atrium: Left atrial size was normal in size. Right Atrium: Right atrial size was normal in size. Pericardium: There is no evidence of pericardial effusion. Mitral Valve: The mitral valve is normal in  structure. Mild to moderate mitral valve regurgitation, with posteriorly-directed jet. No evidence of mitral valve stenosis. MV peak gradient, 4.6 mmHg. The mean mitral valve gradient is 2.0 mmHg. Tricuspid Valve: The tricuspid valve is normal in structure. Tricuspid valve regurgitation is not demonstrated. No evidence of tricuspid stenosis. Aortic Valve: The aortic valve is normal in structure. Aortic valve regurgitation is not visualized. Aortic valve sclerosis is present, with no evidence of aortic valve stenosis. Aortic valve mean gradient measures 3.0 mmHg. Aortic valve peak gradient measures 5.4 mmHg. Aortic valve area, by VTI measures 1.68 cm. Pulmonic Valve: The pulmonic valve was normal in structure. Pulmonic valve regurgitation is not visualized. No evidence of pulmonic stenosis. Aorta: The aortic root is normal in size and structure. Venous: The inferior vena cava is normal in size with greater than 50% respiratory variability, suggesting right atrial pressure of 3 mmHg. IAS/Shunts: No atrial level shunt detected by color flow Doppler.  LEFT VENTRICLE PLAX 2D LVIDd:         4.20 cm     Diastology LVIDs:         2.90 cm     LV e' medial:    8.16 cm/s LV PW:         0.90 cm     LV E/e' medial:  11.4 LV IVS:        0.90 cm     LV e' lateral:   9.03 cm/s LVOT diam:     1.70 cm     LV E/e' lateral: 10.3 LV SV:         48 LV SV Index:   30 LVOT Area:     2.27 cm  LV Volumes (MOD) LV vol d, MOD A2C: 59.3 ml LV vol d, MOD A4C: 67.2 ml LV vol s, MOD A2C: 20.7 ml LV vol s, MOD A4C: 28.3 ml LV SV MOD A2C:     38.6 ml LV SV MOD A4C:     67.2 ml LV SV MOD BP:      39.7 ml RIGHT VENTRICLE             IVC RV Basal diam:  3.00 cm     IVC diam: 1.40 cm RV Mid diam:  2.10 cm RV S prime:     10.70 cm/s TAPSE (M-mode): 2.2 cm LEFT ATRIUM             Index        RIGHT ATRIUM          Index LA diam:        3.50 cm 2.20 cm/m   RA Area:     9.39 cm LA Vol (A2C):   75.5 ml 47.52 ml/m  RA Volume:   15.50 ml 9.76 ml/m LA  Vol (A4C):   47.2 ml 29.71 ml/m LA Biplane Vol: 62.4 ml 39.28 ml/m  AORTIC VALVE                    PULMONIC VALVE AV Area (Vmax):    1.79 cm     PV Vmax:        0.90 m/s AV Area (Vmean):   1.68 cm     PV Vmean:       66.900 cm/s AV Area (VTI):     1.68 cm     PV VTI:         0.216 m AV Vmax:           116.00 cm/s  PV Peak grad:   3.3 mmHg AV Vmean:          81.400 cm/s  PV Mean grad:   2.0 mmHg AV VTI:            0.283 m      RVOT Peak grad: 1 mmHg AV Peak Grad:      5.4 mmHg AV Mean Grad:      3.0 mmHg LVOT Vmax:         91.70 cm/s LVOT Vmean:        60.400 cm/s LVOT VTI:          0.210 m LVOT/AV VTI ratio: 0.74  AORTA Ao Root diam: 2.60 cm MITRAL VALVE MV Area (PHT): 3.72 cm       SHUNTS MV Area VTI:   1.58 cm       Systemic VTI:  0.21 m MV Peak grad:  4.6 mmHg       Systemic Diam: 1.70 cm MV Mean grad:  2.0 mmHg       Pulmonic VTI:  0.109 m MV Vmax:       1.07 m/s MV Vmean:      73.7 cm/s MV Decel Time: 204 msec MR Peak grad:    85.4 mmHg MR Vmax:         462.00 cm/s MR PISA:         1.57 cm MR PISA Eff ROA: 13 mm MR PISA Radius:  0.50 cm MV E velocity: 93.40 cm/s MV A velocity: 69.40 cm/s MV E/A ratio:  1.35 Deatrice Cage MD Electronically signed by Deatrice Cage MD Signature Date/Time: 04/25/2024/9:15:40 AM    Final    MR BRAIN WO CONTRAST Result Date: 04/24/2024 EXAM: MRI BRAIN WITHOUT CONTRAST 04/24/2024 07:55:58 PM TECHNIQUE: Multiplanar multisequence MRI of the head/brain was performed without the administration of intravenous contrast. COMPARISON: CTA head and neck same day CLINICAL HISTORY: Intermittent vertigo, elevated BPs. Pt arrives via [facility] from home for a near syncope episode. FINDINGS: BRAIN AND VENTRICLES: No acute infarct. No intracranial hemorrhage. No mass. No midline shift. No hydrocephalus. The sella is unremarkable. Normal flow voids. ORBITS: No acute abnormality. SINUSES AND MASTOIDS: No acute abnormality. BONES AND SOFT TISSUES: Normal marrow signal. No acute  soft tissue  abnormality. IMPRESSION: 1. No acute intracranial abnormality. Electronically signed by: Gilmore Molt MD 04/24/2024 08:00 PM EDT RP Workstation: [id number]   CT ANGIO HEAD NECK W WO CM Result Date: 04/24/2024 EXAM: CT HEAD WITHOUT CTA HEAD AND NECK WITH AND WITHOUT 04/24/2024 05:58:37 PM TECHNIQUE: CTA of the head and neck was performed with and without the administration of intravenous contrast. Noncontrast CT of the head with reconstructed 2-D images are also provided for review. Multiplanar 2D and/or 3D reformatted images are provided for review. Automated exposure control, iterative reconstruction, and/or weight based adjustment of the mA/kV was utilized to reduce the radiation dose to as low as reasonably achievable. COMPARISON: CT head without contrast 08/25/2007. CLINICAL HISTORY: FINDINGS: CT HEAD: BRAIN AND VENTRICLES: No acute intracranial hemorrhage. No mass effect or midline shift. No extra-axial fluid collection. Gray-white differentiation is maintained. No hydrocephalus. ORBITS: No acute abnormality. SINUSES: No acute abnormality. SOFT TISSUES AND SKULL: No acute abnormality. CTA NECK: AORTIC ARCH AND ARCH VESSELS: Common origin of the left common carotid artery and the innominate artery is noted. Atherosclerotic changes are present in the distal aortic arch. No aneurysm or stenosis is present. No dissection or arterial injury. No significant stenosis of the brachiocephalic or subclavian arteries. CERVICAL CAROTID ARTERIES: Atherosclerotic changes are at the left carotid bifurcation proximal ICA. The minimal luminal diameter is 2.1 mm. As compared to distal left ICA lumen of 3.7 mm. This results in 45% stenosis of the left internal carotid artery at the bifurcation by NASCET criteria. No dissection, arterial injury, or hemodynamically significant stenosis by NASCET criteria. CERVICAL VERTEBRAL ARTERIES: Vertebral artery is slightly dominant. No dissection, arterial injury, or significant  stenosis. VISUALIZED LUNGS AND MEDIASTINUM: Unremarkable. SOFT TISSUES: No acute abnormality. BONES: No acute abnormality. CTA HEAD: ANTERIOR CIRCULATION: No significant stenosis of the internal carotid arteries. Mild narrowing is present in the proximal left A1 segment. Moderate stenosis is present in the distal right M1 segment. No aneurysm. POSTERIOR CIRCULATION: Mild narrowing is present in the left V4 segment. High-grade stenosis and occlusion is present in the distal left posterior cerebral artery P3 segment. No significant stenosis of the basilar artery. No aneurysm. OTHER: No dural venous sinus thrombosis on this non-dedicated study. IMPRESSION: 1. No acute intracranial abnormality. 2. 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 3. 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 4. 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 Electronically signed by: Lonni Necessary MD 04/24/2024 06:27 PM EDT RP Workstation: [id number]   DG Chest 2 View Result Date: 04/24/2024 CLINICAL DATA:  Shortness of breath. Near syncope. Chest tightness. EXAM: CHEST - 2 VIEW COMPARISON:  01/02/2022 FINDINGS: The heart is mildly enlarged. Mediastinal contours are stable. No pulmonary edema. No focal airspace disease, pleural effusion, or pneumothorax. No acute osseous findings. IMPRESSION: Mild cardiomegaly. Electronically Signed   By: Andrea Gasman M.D.   On: 04/24/2024 18:26    Microbiology: No results found for this or any previous visit.  Labs: CBC: Recent Labs  Lab 04/24/24 1610  WBC 3.8*  HGB 9.9*  HCT 31.8*  MCV 70.5*  PLT 104*   Basic Metabolic Panel: Recent Labs  Lab 04/24/24 1610  NA 142  K 3.6  CL 104  CO2 25  GLUCOSE 105*  BUN 18  CREATININE 0.99  CALCIUM 9.6   Liver Function Tests: Recent Labs  Lab 04/24/24 1610  AST 50*  ALT 32  ALKPHOS 275*  BILITOT 1.3*  PROT  8.4*  ALBUMIN 3.8   CBG: Recent Labs   Lab 04/24/24 1841  GLUCAP 100*    Discharge time spent:  35 minutes.  Signed: Drue ONEIDA Potter, MD [facility] 04/25/2024

## 2024-04-26 ENCOUNTER — Telehealth: Payer: Self-pay

## 2024-04-26 NOTE — Transitions of Care (Post Inpatient/ED Visit) (Signed)
   04/26/2024  Name: Valerie Rojas MRN: [id number] DOB: Jun 04, 1956  Today's TOC FU Call Status: Today's TOC FU Call Status:: Successful TOC FU Call Completed TOC FU Call Complete Date: 04/26/24 Patient's Name and Date of Birth confirmed.  Transition Care Management Follow-up Telephone Call Date of Discharge: 04/25/24 Discharge Facility: [facility]) Type of Discharge: Inpatient Admission Primary Inpatient Discharge Diagnosis:: TIA How have you been since you were released from the hospital?: Better Any questions or concerns?: No  Items Reviewed: Did you receive and understand the discharge instructions provided?: Yes Medications obtained,verified, and reconciled?: Yes (Medications Reviewed) Any new allergies since your discharge?: No Dietary orders reviewed?: Yes Do you have support at home?: Yes People in Home [RPT]: other relative(s)  Medications Reviewed Today: Medications Reviewed Today     Reviewed by Emmitt Pan, LPN (Licensed Practical Nurse) on 04/26/24 at 1523  Med List Status: <None>   Medication Order Taking? Sig Documenting Provider Last Dose Status Informant  0.9 %  sodium chloride  infusion [id number]   Eda Iha, MD  Active   aspirin 81 MG chewable tablet [id number] Yes Chew 1 tablet (81 mg total) by mouth daily. Dorinda Drue DASEN, MD  Active   atorvastatin (LIPITOR) 40 MG tablet [id number] Yes Take 1 tablet (40 mg total) by mouth daily. Dorinda Drue DASEN, MD  Active   carvedilol (COREG) 3.125 MG tablet [id number] Yes Take 3.125 mg by mouth 2 (two) times daily with a meal. [provider]  Active Self  cholecalciferol (VITAMIN D3) 25 MCG (1000 UNIT) tablet [id number] Yes Take 1,000 Units by mouth daily. [provider]  Active Self  clopidogrel (PLAVIX) 75 MG tablet [id number] Yes Take 1 tablet (75 mg total) by mouth daily for 21 days. Dorinda Drue DASEN, MD  Active   LIVDELZI 10 MG CAPS [id number] Yes Take 1 capsule by  mouth daily. [provider]  Active Self  ursodiol (ACTIGALL) 250 MG tablet [id number] Yes Take 250 mg by mouth 2 (two) times daily. 2 tablets in the morning and 1 tablet in the evening [provider]  Active Self           Med Note ZENA NATHANAEL LITTIE Austin Apr 24, 2024  8:40 PM) 2 tablets in the morning and 1 tablet in the evening  vitamin B-12 (CYANOCOBALAMIN) 100 MCG tablet [id number] Yes Take 100 mcg by mouth daily. [provider]  Active Self            Home Care and Equipment/Supplies: Were Home Health Services Ordered?: NA Any new equipment or medical supplies ordered?: NA  Functional Questionnaire: Do you need assistance with bathing/showering or dressing?: No Do you need assistance with meal preparation?: No Do you need assistance with eating?: No Do you have difficulty maintaining continence: No Do you need assistance with getting out of bed/getting out of a chair/moving?: No Do you have difficulty managing or taking your medications?: No  Follow up appointments reviewed: PCP Follow-up appointment confirmed?: Yes Date of PCP follow-up appointment?: 04/28/24 Follow-up Provider: [facility] Follow-up appointment confirmed?: NA Do you need transportation to your follow-up appointment?: No Do you understand care options if your condition(s) worsen?: Yes-patient verbalized understanding    SIGNATURE Pan Emmitt, LPN [facility] Nurse Health Advisor Direct Dial [phone]

## 2024-04-28 ENCOUNTER — Ambulatory Visit (INDEPENDENT_AMBULATORY_CARE_PROVIDER_SITE_OTHER): Admitting: General Practice

## 2024-04-28 ENCOUNTER — Encounter: Payer: Self-pay | Admitting: General Practice

## 2024-04-28 VITALS — BP 156/74 | HR 71 | Temp 97.8°F | Ht 62.0 in | Wt 128.0 lb

## 2024-04-28 DIAGNOSIS — I6522 Occlusion and stenosis of left carotid artery: Secondary | ICD-10-CM

## 2024-04-28 DIAGNOSIS — K766 Portal hypertension: Secondary | ICD-10-CM

## 2024-04-28 DIAGNOSIS — Z09 Encounter for follow-up examination after completed treatment for conditions other than malignant neoplasm: Secondary | ICD-10-CM | POA: Insufficient documentation

## 2024-04-28 DIAGNOSIS — K743 Primary biliary cirrhosis: Secondary | ICD-10-CM | POA: Diagnosis not present

## 2024-04-28 DIAGNOSIS — I6622 Occlusion and stenosis of left posterior cerebral artery: Secondary | ICD-10-CM

## 2024-04-28 NOTE — Assessment & Plan Note (Signed)
 Reviewed hospital notes, imaging and labs.

## 2024-04-28 NOTE — Patient Instructions (Addendum)
 Call Dr. Nohemi office and ask about the plavix, aspirin and statin use.   Update me on what they say.   Keep f/u as scheduled.   It was a pleasure to see you today!

## 2024-04-28 NOTE — Progress Notes (Signed)
 Established Patient Office Visit  Subjective   Patient ID: Valerie Rojas, female    DOB: 02/03/1956  Age: 68 y.o. MRN: [id number]  Chief Complaint  Patient presents with   Hospitalization Follow-up    From CVA, vertigo. Patient has not started medication given at the hospital; baby aspirin, atorvastatin and plavix. Needs to discuss this today.     HPI  Valerie Rojas is a 68 year old female with past medical history of portal hypertension, TIA, GERD, primary biliary cholangitis, hepatic cirrhosis, lupus, pancytopenia, lupus, elevated LFTs presents today for a hospital follow up.   Discussed the use of AI scribe software for clinical note transcription with the patient, who gave verbal consent to proceed.  History of Present Illness On October 26th, she experienced an episode where she nearly lost consciousness while at her chicken coop. She described the sensation as being hit by a 'sledgehammer', with everything spinning and the sides of her vision darkening. She managed to hold onto the side of the chicken coop to prevent falling. She did not lose consciousness but felt her body was 'dead weight' and had no strength.  During the episode, she experienced chest tightness in the middle of her chest, which initially made her think she might be having a heart attack. Her heart was pounding, and she felt short of breath when going to the bathroom. Her blood pressure was recorded at 199/81 in the ER, having been over 200 when EMS arrived. She underwent various tests in the hospital, including a chest X-ray, blood work, MRI, and echocardiogram.  She has a history of high cholesterol, which she attributes to her primary biliary cholangitis. She is aware of a 45% plaque buildup in her left proximal internal artery. She has concerns taking aspirin due to her stage four liver condition, which includes cirrhosis and liver stiffness. She is cautious about medications due to potential liver  complications. She currently takes ursodiol, carvedilol, and lisinopril regularly.  Her family history is significant for strokes; her mother died at 47 from a massive stroke, her aunt has had surgery for plaque buildup, and her grandmother also had a massive stroke at 27. She has noticed bruising on her body, which she associates with her liver condition and is concerned about the effects of blood thinners on this bruising.   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% (04/24/2024) 04/24/2024   Pancytopenia (HCC) 04/24/2024   Hypertensive urgency 04/24/2024   Postural dizziness with presyncope 04/24/2024   Discoid lupus    Establishing care with new doctor, encounter for 04/12/2024   Primary biliary cholangitis (HCC) 04/12/2024   Portal hypertension (HCC) 04/12/2024   Hepatic 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 (HCC) 04/12/2024   Lupus (systemic lupus erythematosus) (HCC) 06/17/2021   Elevated liver enzymes 04/03/2013   GERD 09/02/2007   Past Medical History:  Diagnosis Date   Anemia    pt reports a while back   Discoid lupus    Hyperlipidemia    high level on meds to prevent getting worse   Hypertension    Liver disease    Other specified disorders of liver    In process of dx issues with liver/bile duct   Past Surgical History:  Procedure Laterality Date   COLONOSCOPY  2011-[facility]  LIVER BIOPSY     No Known Allergies       04/28/2024   10:37 AM  Depression screen PHQ 2/9  Decreased Interest 0  Down, Depressed, Hopeless 0  PHQ - 2 Score 0  Altered sleeping 1  Tired, decreased energy 1  Change in appetite 0  Feeling bad or failure about yourself  0  Trouble concentrating 0  Moving slowly or fidgety/restless 0  Suicidal thoughts 0   PHQ-9 Score 2  Difficult doing work/chores Not difficult at all       04/28/2024   10:38 AM  GAD 7 : Generalized Anxiety Score  Nervous, Anxious, on Edge 0  Control/stop worrying 0  Worry too much - different things 1  Trouble relaxing 0  Restless 0  Easily annoyed or irritable 0  Afraid - awful might happen 0  Total GAD 7 Score 1  Anxiety Difficulty Not difficult at all      Review of Systems  Constitutional:  Negative for chills and fever.  Respiratory:  Negative for shortness of breath.   Cardiovascular:  Negative for chest pain.  Gastrointestinal:  Negative for abdominal pain, constipation, diarrhea, heartburn, nausea and vomiting.  Genitourinary:  Negative for dysuria, frequency and urgency.  Neurological:  Negative for dizziness and headaches.  Endo/Heme/Allergies:  Negative for polydipsia.  Psychiatric/Behavioral:  Negative for depression and suicidal ideas. The patient is not nervous/anxious.       Objective:     BP (!) 156/74   Pulse 71   Temp 97.8 F (36.6 C) (Temporal)   Ht 5' 2 (1.575 m)   Wt 128 lb (58.1 kg)   SpO2 98%   BMI 23.41 kg/m  BP Readings from Last 3 Encounters:  04/28/24 (!) 156/74  04/25/24 (!) 150/63  04/12/24 136/80   Wt Readings from Last 3 Encounters:  04/28/24 128 lb (58.1 kg)  04/25/24 129 lb 6.6 oz (58.7 kg)  04/12/24 127 lb (57.6 kg)      Physical Exam Vitals and nursing note reviewed.  Constitutional:      Appearance: Normal appearance.  Cardiovascular:     Rate and Rhythm: Normal rate and regular rhythm.     Pulses: Normal pulses.     Heart sounds: Normal heart sounds.  Pulmonary:     Effort: Pulmonary effort is normal.     Breath sounds: Normal breath sounds.  Neurological:     Mental Status: She is alert and oriented to person, place, and time.  Psychiatric:        Mood and Affect: Mood normal.        Behavior: Behavior normal.        Thought Content: Thought content normal.        Judgment: Judgment  normal.      No results found for any visits on 04/28/24.     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  MRI showed no acute infarct. CTA confirmed occlusion. Neurology advised against Plavix, recommended aspirin, but concerns exist due to liver condition and bruising. - Long discussion about the importance of ASA therapy.  - Neuro exam stable today. - Patient will discuss aspirin and statin use considering primary biliary cholangitis and liver function with her liver transplant team and will update.  - Consider cardiology referral for further evaluation and management. - Advise ER visit if symptoms recur.  Primary biliary cholangitis with cirrhosis and associated bruising Primary biliary cholangitis with cirrhosis, stage 4, with associated bruising. Concerns about aspirin and Plavix use due to potential liver condition exacerbation and bruising. - Continue Ursodiol and Livdelzi per GI.  Hypertension Hypertension with recent elevated readings managed with carvedilol and lisinopril. - Sightly above goal today.  - Continue carvedilol and lisinopril. - Will continue to monitor.  Mixed hyperlipidemia Mixed hyperlipidemia with concerns about statins due to potential liver enzyme elevation.  - She will discuss statin use with  transplant team and will update.     Return if symptoms worsen or fail to improve.    Carrol Aurora, NP

## 2024-05-02 ENCOUNTER — Encounter: Payer: Self-pay | Admitting: General Practice

## 2024-05-02 DIAGNOSIS — D61818 Other pancytopenia: Secondary | ICD-10-CM

## 2024-05-02 DIAGNOSIS — G459 Transient cerebral ischemic attack, unspecified: Secondary | ICD-10-CM

## 2024-05-04 ENCOUNTER — Inpatient Hospital Stay: Attending: Oncology | Admitting: Oncology

## 2024-05-04 ENCOUNTER — Encounter: Payer: Self-pay | Admitting: Oncology

## 2024-05-04 ENCOUNTER — Inpatient Hospital Stay

## 2024-05-04 VITALS — BP 159/68 | HR 67 | Temp 97.2°F | Resp 18 | Ht 62.0 in | Wt 127.5 lb

## 2024-05-04 DIAGNOSIS — Z8673 Personal history of transient ischemic attack (TIA), and cerebral infarction without residual deficits: Secondary | ICD-10-CM | POA: Insufficient documentation

## 2024-05-04 DIAGNOSIS — K743 Primary biliary cirrhosis: Secondary | ICD-10-CM | POA: Diagnosis not present

## 2024-05-04 DIAGNOSIS — D72819 Decreased white blood cell count, unspecified: Secondary | ICD-10-CM

## 2024-05-04 DIAGNOSIS — I6522 Occlusion and stenosis of left carotid artery: Secondary | ICD-10-CM | POA: Diagnosis not present

## 2024-05-04 DIAGNOSIS — K766 Portal hypertension: Secondary | ICD-10-CM | POA: Insufficient documentation

## 2024-05-04 DIAGNOSIS — D696 Thrombocytopenia, unspecified: Secondary | ICD-10-CM | POA: Diagnosis not present

## 2024-05-04 DIAGNOSIS — D61818 Other pancytopenia: Secondary | ICD-10-CM | POA: Insufficient documentation

## 2024-05-04 DIAGNOSIS — D509 Iron deficiency anemia, unspecified: Secondary | ICD-10-CM | POA: Diagnosis not present

## 2024-05-04 DIAGNOSIS — L930 Discoid lupus erythematosus: Secondary | ICD-10-CM | POA: Diagnosis not present

## 2024-05-04 LAB — CBC WITH DIFFERENTIAL/PLATELET
Abs Immature Granulocytes: 0.01 K/uL (ref 0.00–0.07)
Basophils Absolute: 0 K/uL (ref 0.0–0.1)
Basophils Relative: 1 %
Eosinophils Absolute: 0.1 K/uL (ref 0.0–0.5)
Eosinophils Relative: 2 %
HCT: 29.8 % — ABNORMAL LOW (ref 36.0–46.0)
Hemoglobin: 9.5 g/dL — ABNORMAL LOW (ref 12.0–15.0)
Immature Granulocytes: 0 %
Lymphocytes Relative: 47 %
Lymphs Abs: 2.3 K/uL (ref 0.7–4.0)
MCH: 22.2 pg — ABNORMAL LOW (ref 26.0–34.0)
MCHC: 31.9 g/dL (ref 30.0–36.0)
MCV: 69.8 fL — ABNORMAL LOW (ref 80.0–100.0)
Monocytes Absolute: 0.5 K/uL (ref 0.1–1.0)
Monocytes Relative: 10 %
Neutro Abs: 1.9 K/uL (ref 1.7–7.7)
Neutrophils Relative %: 40 %
Platelets: 133 K/uL — ABNORMAL LOW (ref 150–400)
RBC: 4.27 MIL/uL (ref 3.87–5.11)
RDW: 15.9 % — ABNORMAL HIGH (ref 11.5–15.5)
WBC: 4.8 K/uL (ref 4.0–10.5)
nRBC: 0 % (ref 0.0–0.2)

## 2024-05-04 LAB — VITAMIN B12: Vitamin B-12: 1111 pg/mL — ABNORMAL HIGH (ref 180–914)

## 2024-05-04 LAB — LACTATE DEHYDROGENASE: LDH: 167 U/L (ref 98–192)

## 2024-05-04 LAB — TECHNOLOGIST SMEAR REVIEW: Plt Morphology: NORMAL

## 2024-05-04 LAB — FOLATE: Folate: 12.3 ng/mL (ref >5.9)

## 2024-05-04 LAB — IRON AND TIBC
Iron: 117 ug/dL (ref 28–170)
Saturation Ratios: 26 % (ref 10.4–31.8)
TIBC: 442 ug/dL (ref 250–450)
UIBC: 325 ug/dL

## 2024-05-04 LAB — FERRITIN: Ferritin: 256 ng/mL (ref 11–307)

## 2024-05-04 NOTE — Assessment & Plan Note (Signed)
Follow up with hepatology.

## 2024-05-04 NOTE — Assessment & Plan Note (Signed)
 Chronic microcytic anemia.  Check iron TIBC ferritin, B12 and folate.  She may have underlying hemoglobinopathy

## 2024-05-04 NOTE — Assessment & Plan Note (Signed)
 Check peripheral blood flow cytometry, folate, B12 multiple myeloma panel, light chain ratio.

## 2024-05-04 NOTE — Assessment & Plan Note (Signed)
 The skin hypopigmentation areas on her trunk may be secondary to discoid lupus.  Recommend patient to follow-up with rheumatology

## 2024-05-04 NOTE — Progress Notes (Signed)
 Hematology/Oncology Consult note [phone] [phone]        REFERRING PROVIDER: Vincente Shivers, NP   CHIEF COMPLAINTS/REASON FOR VISIT:  Evaluation of pancytopenia   ASSESSMENT & PLAN:   Hepatic 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 (HCC) Follow up with hepatology  Leukopenia Chronic leukopenia, could be due to liver cirrhosis and PBC. Rule out other etiologies.  Check cbc, B12, folate, peripheral flow cytometry multiple myeloma panel, light chain ratio  Microcytic anemia Chronic microcytic anemia.  Check iron TIBC ferritin, B12 and folate.  She may have underlying hemoglobinopathy  Thrombocytopenia Check peripheral blood flow cytometry, folate, B12 multiple myeloma panel, light chain ratio.  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  Recommend patient to follow-up with rheumatology   Orders Placed This Encounter  Procedures   Ferritin    Standing Status:   Future    Number of Occurrences:   1    Expected Date:   05/04/2024    Expiration Date:   08/02/2024   Iron and TIBC    Standing Status:   Future    Number of Occurrences:   1    Expected Date:   05/04/2024    Expiration Date:   08/02/2024   Folate    Standing Status:   Future    Number of Occurrences:   1    Expected Date:   05/04/2024    Expiration Date:   08/02/2024   Vitamin B12    Standing Status:   Future    Number of Occurrences:   1    Expected Date:   05/04/2024    Expiration Date:   08/02/2024   CBC with Differential/Platelet    Standing Status:   Future    Number of Occurrences:   1    Expected Date:   05/04/2024    Expiration Date:   08/02/2024   Haptoglobin    Standing Status:   Future    Number of Occurrences:   1    Expected Date:   05/04/2024    Expiration Date:   08/02/2024   Lactate dehydrogenase    Standing Status:   Future    Number of Occurrences:   1    Expected Date:   05/04/2024    Expiration Date:   08/02/2024    Multiple Myeloma Panel (SPEP&IFE w/QIG)    Standing Status:   Future    Number of Occurrences:   1    Expected Date:   05/04/2024    Expiration Date:   08/02/2024   Kappa/lambda light chains    Standing Status:   Future    Number of Occurrences:   1    Expected Date:   05/04/2024    Expiration Date:   08/02/2024   Flow cytometry panel-leukemia/lymphoma work-up    Standing Status:   Future    Number of Occurrences:   1    Expected Date:   05/04/2024    Expiration Date:   08/02/2024   Technologist smear review    Standing Status:   Future    Number of Occurrences:   1    Expected Date:   05/04/2024    Expiration Date:   05/04/2025    Clinical information::   pancytopenia    All questions were answered. The patient knows to call the clinic with any problems, questions or concerns.  Zelphia Cap, MD, PhD [facility] Hematology Oncology 05/04/2024   HISTORY OF  PRESENTING ILLNESS:   Valerie Rojas is a  68 y.o.  female with PMH listed below was seen in consultation at the request of  Vincente Shivers, NP  for evaluation of pancytopenia  Discussed the use of AI scribe software for clinical note transcription with the patient, who gave verbal consent to proceed.   She has a history of primary biliary cholangitis (PBC) and liver cirrhosis, with associated portal hypertension. She takes Carvedilol twice daily for portal hypertension. Her liver specialist monitors her every six months with full liver screening and imaging. She has been on ursodiol for years, which maintained her enzyme levels, but switched to [place] after experiencing bruising with [place] .  She has chronic pancytopenia.   She currently takes ursodiol 300 mg three times a day and Livdelzy without side effects.  Recently, she experienced a minor transient ischemic attack (TIA) on April 24, 2024, and was treated in the emergency department. 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 She was started on Plavix and a  cholesterol-lowering medication.   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 She reports stable weight and good appetite. No excessive night sweats. She has noticed skin discoloration on her trunk    MEDICAL HISTORY:  Past Medical History:  Diagnosis Date   Anemia    pt reports a while back   Discoid lupus    Hyperlipidemia    high level on meds to prevent getting worse   Hypertension    Liver disease    Other specified disorders of liver    In process of dx issues with liver/bile duct    SURGICAL HISTORY: Past Surgical History:  Procedure Laterality Date   COLONOSCOPY  2011-[facility]   LIVER BIOPSY      SOCIAL HISTORY: Social History   Socioeconomic History   Marital status: Widowed    Spouse name: Not on file   Number of children: Not on file   Years of education: Not on file   Highest education level: GED or equivalent  Occupational History   Not on file  Tobacco Use   Smoking status: Never   Smokeless tobacco: Never  Vaping Use   Vaping status: Never Used  Substance and Sexual Activity   Alcohol use: No   Drug use: No   Sexual activity: Yes  Other Topics Concern   Not on file  Social History Narrative   Not on file   Social Drivers of Health   Financial Resource Strain: Low Risk  (04/11/2024)   Overall Financial Resource Strain (CARDIA)    Difficulty of Paying Living Expenses: Not very hard  Food Insecurity: No Food Insecurity (05/04/2024)   Hunger Vital Sign    Worried About Running Out of Food in the Last Year: Never true    Ran Out of Food in the Last Year: Never true  Transportation Needs: No Transportation Needs (05/04/2024)   PRAPARE - Administrator, Civil Service (Medical): No    Lack of Transportation (Non-Medical): No  Physical Activity: Sufficiently Active (04/11/2024)   Exercise Vital Sign    Days of Exercise per  Week: 5 days    Minutes of Exercise per Session: 90 min  Stress: No Stress Concern Present (04/11/2024)   [employer] of Occupational Health - Occupational Stress Questionnaire    Feeling of Stress: Only a little  Social Connections: Moderately  Integrated (04/25/2024)   Social Connection and Isolation Panel    Frequency of Communication with Friends and Family: More than three times a week    Frequency of Social Gatherings with Friends and Family: Once a week    Attends Religious Services: 1 to 4 times per year    Active Member of [employer] or Organizations: Yes    Attends Banker Meetings: 1 to 4 times per year    Marital Status: Widowed  Intimate Partner Violence: Not At Risk (05/04/2024)   Humiliation, Afraid, Rape, and Kick questionnaire    Fear of Current or Ex-Partner: No    Emotionally Abused: No    Physically Abused: No    Sexually Abused: No    FAMILY HISTORY: Family History  Problem Relation Age of Onset   Heart disease Mother    Stroke Mother    Diabetes Father    Diabetes Brother    Colon cancer Paternal Aunt        50's at dx   Heart disease Maternal Grandmother    Diabetes Paternal Grandmother    Esophageal cancer Neg Hx    Rectal cancer Neg Hx    Stomach cancer Neg Hx    Colon polyps Neg Hx    Pancreatic cancer Neg Hx     ALLERGIES:  has no known allergies.  MEDICATIONS:  Current Outpatient Medications  Medication Sig Dispense Refill   atorvastatin (LIPITOR) 40 MG tablet Take 1 tablet (40 mg total) by mouth daily. 30 tablet 2   carvedilol (COREG) 3.125 MG tablet Take 3.125 mg by mouth 2 (two) times daily with a meal.     cholecalciferol (VITAMIN D3) 25 MCG (1000 UNIT) tablet Take 1,000 Units by mouth daily.     clopidogrel (PLAVIX) 75 MG tablet Take 1 tablet (75 mg total) by mouth daily for 21 days. 21 tablet 0   LIVDELZI 10 MG CAPS Take 1 capsule by mouth daily.     ursodiol (ACTIGALL) 250 MG tablet Take 250 mg by mouth 2 (two) times  daily. 2 tablets in the morning and 1 tablet in the evening     vitamin B-12 (CYANOCOBALAMIN) 100 MCG tablet Take 100 mcg by mouth daily.     Current Facility-Administered Medications  Medication Dose Route Frequency Provider Last Rate Last Admin   0.9 %  sodium chloride  infusion  500 mL Intravenous Once Beavers, Kimberly, MD        Review of Systems  Constitutional:  Negative for appetite change, chills, fatigue and fever.  HENT:   Negative for hearing loss and voice change.   Eyes:  Negative for eye problems.  Respiratory:  Negative for chest tightness and cough.   Cardiovascular:  Negative for chest pain.  Gastrointestinal:  Negative for abdominal distention, abdominal pain and blood in stool.  Endocrine: Negative for hot flashes.  Genitourinary:  Negative for difficulty urinating and frequency.   Musculoskeletal:  Negative for arthralgias.  Skin:  Negative for itching and rash.  Neurological:  Negative for extremity weakness.  Hematological:  Negative for adenopathy.  Psychiatric/Behavioral:  Negative for confusion.    PHYSICAL EXAMINATION:  Vitals:   05/04/24 1357 05/04/24 1406  BP: (!) 156/66 (!) 159/68  Pulse: 67   Resp: 18   Temp: (!) 97.2 F (36.2 C)    Filed Weights   05/04/24 1357  Weight: 127 lb 8 oz (57.8 kg)    Physical Exam Constitutional:      General: She is not in acute  distress. HENT:     Head: Normocephalic and atraumatic.  Eyes:     General: No scleral icterus. Cardiovascular:     Rate and Rhythm: Normal rate and regular rhythm.     Heart sounds: Normal heart sounds.  Pulmonary:     Effort: Pulmonary effort is normal. No respiratory distress.     Breath sounds: No wheezing.  Abdominal:     General: Bowel sounds are normal. There is no distension.     Palpations: Abdomen is soft.  Musculoskeletal:        General: No deformity. Normal range of motion.     Cervical back: Normal range of motion and neck supple.  Skin:    General: Skin is warm  and dry.     Findings: No erythema or rash.     Comments: Areas of hyperpigmentation on her trunk  Neurological:     Mental Status: She is alert and oriented to person, place, and time. Mental status is at baseline.     Cranial Nerves: No cranial nerve deficit.     Coordination: Coordination normal.  Psychiatric:        Mood and Affect: Mood normal.     LABORATORY DATA:  I have reviewed the data as listed    Latest Ref Rng & Units 05/04/2024    3:15 PM 04/24/2024    4:10 PM 04/28/2022    2:56 PM  CBC  WBC 4.0 - 10.5 K/uL 4.8  3.8  4.4   Hemoglobin 12.0 - 15.0 g/dL 9.5  9.9  89.9   Hematocrit 36.0 - 46.0 % 29.8  31.8  31.3   Platelets 150 - 400 K/uL 133  104  145.0       Latest Ref Rng & Units 04/24/2024    4:10 PM 04/28/2022    2:56 PM 05/19/2018   10:38 AM  CMP  Glucose 70 - 99 mg/dL 894  95  885   BUN 8 - 23 mg/dL 18  15  16    Creatinine 0.44 - 1.00 mg/dL 9.00  9.35  9.28   Sodium 135 - 145 mmol/L 142  138  138   Potassium 3.5 - 5.1 mmol/L 3.6  4.0  3.9   Chloride 98 - 111 mmol/L 104  103  101   CO2 22 - 32 mmol/L 25  26  28    Calcium 8.9 - 10.3 mg/dL 9.6  9.7  89.2   Total Protein 6.5 - 8.1 g/dL 8.4  8.0  9.0   Total Bilirubin 0.0 - 1.2 mg/dL 1.3  0.6  0.7   Alkaline Phos 38 - 126 U/L 275  379  312   AST 15 - 41 U/L 50  48  51   ALT 0 - 44 U/L 32  45  57       RADIOGRAPHIC STUDIES: I have personally reviewed the radiological images as listed and agreed with the findings in the report. ECHOCARDIOGRAM COMPLETE Result Date: 04/25/2024    ECHOCARDIOGRAM REPORT   Patient Name:   JENESSA GILLINGHAM Date of Exam: 04/25/2024 Medical Rec #:  [id number]      Height:       62.0 in Accession #:    [id number]     Weight:       129.4 lb Date of Birth:  06-15-1956     BSA:          1.589 m Patient Age:    67 years       BP:  144/63 mmHg Patient Gender: F              HR:           65 bpm. Exam Location:  [facility] Procedure: 2D Echo, Cardiac Doppler and Color Doppler (Both  Spectral and Color            Flow Doppler were utilized during procedure). Indications:     Stroke I63.9  History:         Patient has no prior history of Echocardiogram examinations.                  Stroke.  Sonographer:     Rosina Dunk Referring Phys:  [id number] DELAYNE LULLA SOLIAN Diagnosing Phys: Deatrice Cage MD  Sonographer Comments: Image acquisition challenging due to respiratory motion. IMPRESSIONS  1. Left ventricular ejection fraction, by estimation, is 60 to 65%. The left ventricle has normal function. The left ventricle has no regional wall motion abnormalities. Left ventricular diastolic parameters were normal.  2. Right ventricular systolic function is normal. The right ventricular size is normal. Tricuspid regurgitation signal is inadequate for assessing PA pressure.  3. The mitral valve is normal in structure. Mild to moderate mitral valve regurgitation. No evidence of mitral stenosis.  4. The aortic valve is normal in structure. Aortic valve regurgitation is not visualized. Aortic valve sclerosis is present, with no evidence of aortic valve stenosis.  5. The inferior vena cava is normal in size with greater than 50% respiratory variability, suggesting right atrial pressure of 3 mmHg. FINDINGS  Left Ventricle: Left ventricular ejection fraction, by estimation, is 60 to 65%. The left ventricle has normal function. The left ventricle has no regional wall motion abnormalities. The left ventricular internal cavity size was normal in size. There is  no left ventricular hypertrophy. Left ventricular diastolic parameters were normal. Right Ventricle: The right ventricular size is normal. No increase in right ventricular wall thickness. Right ventricular systolic function is normal. Tricuspid regurgitation signal is inadequate for assessing PA pressure. Left Atrium: Left atrial size was normal in size. Right Atrium: Right atrial size was normal in size. Pericardium: There is no evidence of pericardial  effusion. Mitral Valve: The mitral valve is normal in structure. Mild to moderate mitral valve regurgitation, with posteriorly-directed jet. No evidence of mitral valve stenosis. MV peak gradient, 4.6 mmHg. The mean mitral valve gradient is 2.0 mmHg. Tricuspid Valve: The tricuspid valve is normal in structure. Tricuspid valve regurgitation is not demonstrated. No evidence of tricuspid stenosis. Aortic Valve: The aortic valve is normal in structure. Aortic valve regurgitation is not visualized. Aortic valve sclerosis is present, with no evidence of aortic valve stenosis. Aortic valve mean gradient measures 3.0 mmHg. Aortic valve peak gradient measures 5.4 mmHg. Aortic valve area, by VTI measures 1.68 cm. Pulmonic Valve: The pulmonic valve was normal in structure. Pulmonic valve regurgitation is not visualized. No evidence of pulmonic stenosis. Aorta: The aortic root is normal in size and structure. Venous: The inferior vena cava is normal in size with greater than 50% respiratory variability, suggesting right atrial pressure of 3 mmHg. IAS/Shunts: No atrial level shunt detected by color flow Doppler.  LEFT VENTRICLE PLAX 2D LVIDd:         4.20 cm     Diastology LVIDs:         2.90 cm     LV e' medial:    8.16 cm/s LV PW:         0.90 cm  LV E/e' medial:  11.4 LV IVS:        0.90 cm     LV e' lateral:   9.03 cm/s LVOT diam:     1.70 cm     LV E/e' lateral: 10.3 LV SV:         48 LV SV Index:   30 LVOT Area:     2.27 cm  LV Volumes (MOD) LV vol d, MOD A2C: 59.3 ml LV vol d, MOD A4C: 67.2 ml LV vol s, MOD A2C: 20.7 ml LV vol s, MOD A4C: 28.3 ml LV SV MOD A2C:     38.6 ml LV SV MOD A4C:     67.2 ml LV SV MOD BP:      39.7 ml RIGHT VENTRICLE             IVC RV Basal diam:  3.00 cm     IVC diam: 1.40 cm RV Mid diam:    2.10 cm RV S prime:     10.70 cm/s TAPSE (M-mode): 2.2 cm LEFT ATRIUM             Index        RIGHT ATRIUM          Index LA diam:        3.50 cm 2.20 cm/m   RA Area:     9.39 cm LA Vol (A2C):   75.5  ml 47.52 ml/m  RA Volume:   15.50 ml 9.76 ml/m LA Vol (A4C):   47.2 ml 29.71 ml/m LA Biplane Vol: 62.4 ml 39.28 ml/m  AORTIC VALVE                    PULMONIC VALVE AV Area (Vmax):    1.79 cm     PV Vmax:        0.90 m/s AV Area (Vmean):   1.68 cm     PV Vmean:       66.900 cm/s AV Area (VTI):     1.68 cm     PV VTI:         0.216 m AV Vmax:           116.00 cm/s  PV Peak grad:   3.3 mmHg AV Vmean:          81.400 cm/s  PV Mean grad:   2.0 mmHg AV VTI:            0.283 m      RVOT Peak grad: 1 mmHg AV Peak Grad:      5.4 mmHg AV Mean Grad:      3.0 mmHg LVOT Vmax:         91.70 cm/s LVOT Vmean:        60.400 cm/s LVOT VTI:          0.210 m LVOT/AV VTI ratio: 0.74  AORTA Ao Root diam: 2.60 cm MITRAL VALVE MV Area (PHT): 3.72 cm       SHUNTS MV Area VTI:   1.58 cm       Systemic VTI:  0.21 m MV Peak grad:  4.6 mmHg       Systemic Diam: 1.70 cm MV Mean grad:  2.0 mmHg       Pulmonic VTI:  0.109 m MV Vmax:       1.07 m/s MV Vmean:      73.7 cm/s MV Decel Time: 204 msec MR Peak grad:    85.4 mmHg MR Vmax:  462.00 cm/s MR PISA:         1.57 cm MR PISA Eff ROA: 13 mm MR PISA Radius:  0.50 cm MV E velocity: 93.40 cm/s MV A velocity: 69.40 cm/s MV E/A ratio:  1.35 Deatrice Cage MD Electronically signed by Deatrice Cage MD Signature Date/Time: 04/25/2024/9:15:40 AM    Final    MR BRAIN WO CONTRAST Result Date: 04/24/2024 EXAM: MRI BRAIN WITHOUT CONTRAST 04/24/2024 07:55:58 PM TECHNIQUE: Multiplanar multisequence MRI of the head/brain was performed without the administration of intravenous contrast. COMPARISON: CTA head and neck same day CLINICAL HISTORY: Intermittent vertigo, elevated BPs. Pt arrives via [facility] from home for a near syncope episode. FINDINGS: BRAIN AND VENTRICLES: No acute infarct. No intracranial hemorrhage. No mass. No midline shift. No hydrocephalus. The sella is unremarkable. Normal flow voids. ORBITS: No acute abnormality. SINUSES AND MASTOIDS: No acute abnormality. BONES AND SOFT  TISSUES: Normal marrow signal. No acute soft tissue abnormality. IMPRESSION: 1. No acute intracranial abnormality. Electronically signed by: Gilmore Molt MD 04/24/2024 08:00 PM EDT RP Workstation: [id number]   CT ANGIO HEAD NECK W WO CM Result Date: 04/24/2024 EXAM: CT HEAD WITHOUT CTA HEAD AND NECK WITH AND WITHOUT 04/24/2024 05:58:37 PM TECHNIQUE: CTA of the head and neck was performed with and without the administration of intravenous contrast. Noncontrast CT of the head with reconstructed 2-D images are also provided for review. Multiplanar 2D and/or 3D reformatted images are provided for review. Automated exposure control, iterative reconstruction, and/or weight based adjustment of the mA/kV was utilized to reduce the radiation dose to as low as reasonably achievable. COMPARISON: CT head without contrast 08/25/2007. CLINICAL HISTORY: FINDINGS: CT HEAD: BRAIN AND VENTRICLES: No acute intracranial hemorrhage. No mass effect or midline shift. No extra-axial fluid collection. Gray-white differentiation is maintained. No hydrocephalus. ORBITS: No acute abnormality. SINUSES: No acute abnormality. SOFT TISSUES AND SKULL: No acute abnormality. CTA NECK: AORTIC ARCH AND ARCH VESSELS: Common origin of the left common carotid artery and the innominate artery is noted. Atherosclerotic changes are present in the distal aortic arch. No aneurysm or stenosis is present. No dissection or arterial injury. No significant stenosis of the brachiocephalic or subclavian arteries. CERVICAL CAROTID ARTERIES: Atherosclerotic changes are at the left carotid bifurcation proximal ICA. The minimal luminal diameter is 2.1 mm. As compared to distal left ICA lumen of 3.7 mm. This results in 45% stenosis of the left internal carotid artery at the bifurcation by NASCET criteria. No dissection, arterial injury, or hemodynamically significant stenosis by NASCET criteria. CERVICAL VERTEBRAL ARTERIES: Vertebral artery is slightly dominant. No  dissection, arterial injury, or significant stenosis. VISUALIZED LUNGS AND MEDIASTINUM: Unremarkable. SOFT TISSUES: No acute abnormality. BONES: No acute abnormality. CTA HEAD: ANTERIOR CIRCULATION: No significant stenosis of the internal carotid arteries. Mild narrowing is present in the proximal left A1 segment. Moderate stenosis is present in the distal right M1 segment. No aneurysm. POSTERIOR CIRCULATION: Mild narrowing is present in the left V4 segment. High-grade stenosis and occlusion is present in the distal left posterior cerebral artery P3 segment. No significant stenosis of the basilar artery. No aneurysm. OTHER: No dural venous sinus thrombosis on this non-dedicated study. IMPRESSION: 1. No acute intracranial abnormality. 2. 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 3. 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 4. 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 Electronically signed by: Lonni Necessary MD 04/24/2024 06:27 PM EDT RP Workstation: [id number]   DG Chest 2 View Result Date:  04/24/2024 CLINICAL DATA:  Shortness of breath. Near syncope. Chest tightness. EXAM: CHEST - 2 VIEW COMPARISON:  01/02/2022 FINDINGS: The heart is mildly enlarged. Mediastinal contours are stable. No pulmonary edema. No focal airspace disease, pleural effusion, or pneumothorax. No acute osseous findings. IMPRESSION: Mild cardiomegaly. Electronically Signed   By: Andrea Gasman M.D.   On: 04/24/2024 18:26

## 2024-05-04 NOTE — Assessment & Plan Note (Addendum)
 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 Rule out other etiologies.  Check cbc, B12, folate, peripheral flow cytometry multiple myeloma panel, light chain ratio

## 2024-05-05 LAB — HAPTOGLOBIN: Haptoglobin: 45 mg/dL (ref 37–355)

## 2024-05-05 LAB — KAPPA/LAMBDA LIGHT CHAINS
Kappa free light chain: 28.5 mg/L — ABNORMAL HIGH (ref 3.3–19.4)
Kappa, lambda light chain ratio: 1.63 (ref 0.26–1.65)
Lambda free light chains: 17.5 mg/L (ref 5.7–26.3)

## 2024-05-06 LAB — COMP PANEL: LEUKEMIA/LYMPHOMA: Immunophenotypic Profile: 2

## 2024-05-09 LAB — MULTIPLE MYELOMA PANEL, SERUM
Albumin SerPl Elph-Mcnc: 3.9 g/dL (ref 2.9–4.4)
Albumin/Glob SerPl: 1.1 (ref 0.7–1.7)
Alpha 1: 0.3 g/dL (ref 0.0–0.4)
Alpha2 Glob SerPl Elph-Mcnc: 0.6 g/dL (ref 0.4–1.0)
B-Globulin SerPl Elph-Mcnc: 1.6 g/dL — ABNORMAL HIGH (ref 0.7–1.3)
Gamma Glob SerPl Elph-Mcnc: 1.4 g/dL (ref 0.4–1.8)
Globulin, Total: 3.9 g/dL (ref 2.2–3.9)
IgA: 251 mg/dL (ref 87–352)
IgG (Immunoglobin G), Serum: 1889 mg/dL — ABNORMAL HIGH (ref 586–1602)
IgM (Immunoglobulin M), Srm: 304 mg/dL — ABNORMAL HIGH (ref 26–217)
Total Protein ELP: 7.8 g/dL (ref 6.0–8.5)

## 2024-06-03 ENCOUNTER — Ambulatory Visit

## 2024-06-03 VITALS — Ht 61.0 in | Wt 125.0 lb

## 2024-06-03 DIAGNOSIS — Z0000 Encounter for general adult medical examination without abnormal findings: Secondary | ICD-10-CM

## 2024-06-03 NOTE — Patient Instructions (Signed)
 Ms. Lahti,  Thank you for taking the time for your Medicare Wellness Visit. I appreciate your continued commitment to your health goals. Please review the care plan we discussed, and feel free to reach out if I can assist you further.  Please note that Annual Wellness Visits do not include a physical exam. Some assessments may be limited, especially if the visit was conducted virtually. If needed, we may recommend an in-person follow-up with your provider.  Ongoing Care Seeing your primary care provider every 3 to 6 months helps us  monitor your health and provide consistent, personalized care.   Referrals If a referral was made during today's visit and you haven't received any updates within two weeks, please contact the referred provider directly to check on the status.  Recommended Screenings:  Health Maintenance  Topic Date Due   Breast Cancer Screening  08/31/2022   Zoster (Shingles) Vaccine (1 of 2) 07/13/2024*   Flu Shot  09/27/2024*   Pneumococcal Vaccine for age over 45 (1 of 2 - PCV) 04/12/2025*   COVID-19 Vaccine (4 - 2025-26 season) 04/28/2027*   Medicare Annual Wellness Visit  06/03/2025   Colon Cancer Screening  10/05/2030   DTaP/Tdap/Td vaccine (2 - Td or Tdap) 07/12/2031   Osteoporosis screening with Bone Density Scan  Completed   Hepatitis C Screening  Completed   Meningitis B Vaccine  Aged Out   Hepatitis B Vaccine  Discontinued  *Topic was postponed. The date shown is not the original due date.       06/03/2024   11:36 AM  Advanced Directives  Does Patient Have a Medical Advance Directive? No  Would patient like information on creating a medical advance directive? Yes (Inpatient - patient requests chaplain consult to create a medical advance directive);No - Patient declined    Vision: Annual vision screenings are recommended for early detection of glaucoma, cataracts, and diabetic retinopathy. These exams can also reveal signs of chronic conditions such as  diabetes and high blood pressure.  Dental: Annual dental screenings help detect early signs of oral cancer, gum disease, and other conditions linked to overall health, including heart disease and diabetes.

## 2024-06-03 NOTE — Progress Notes (Signed)
 Chief Complaint  Patient presents with   Medicare Wellness     Subjective:  Please attest and cosign this visit due to patients primary care provider not being in the office at the time the visit was completed.  (Pt of Carrol Aurora, NP)   Valerie Rojas is a 68 y.o. female who presents for a Medicare Annual Wellness Visit.  I connected with  Valerie Rojas on 06/03/24 by a audio enabled telemedicine application and verified that I am speaking with the correct person using two identifiers.  Patient Location: Home  Provider Location: Office/Clinic  Persons Participating in Visit: Patient.  I discussed the limitations of evaluation and management by telemedicine. The patient expressed understanding and agreed to proceed.  Vital Signs: Because this visit was a virtual/telehealth visit, some criteria may be missing or patient reported. Any vitals not documented were not able to be obtained and vitals that have been documented are patient reported.   Visit info / Clinical Intake: Medicare Wellness Visit Type:: Initial Annual Wellness Visit Persons participating in visit and providing information:: patient Medicare Wellness Visit Mode:: Telephone If telephone:: video declined Since this visit was completed virtually, some vitals may be partially provided or unavailable. Missing vitals are due to the limitations of the virtual format.: Documented vitals are patient reported If Telephone or Video please confirm:: I connected with patient using audio/video enable telemedicine. I verified patient identity with two identifiers, discussed telehealth limitations, and patient agreed to proceed. Patient Location:: Home Provider Location:: Office Interpreter Needed?: No Pre-visit prep was completed: yes AWV questionnaire completed by patient prior to visit?: no Living arrangements:: (!) lives alone Patient's Overall Health Status Rating: good Typical amount of pain: none Does pain affect  daily life?: no Are you currently prescribed opioids?: no  Dietary Habits and Nutritional Risks How many meals a day?: 2 Eats fruit and vegetables daily?: yes Most meals are obtained by: preparing own meals; eating out In the last 2 weeks, have you had any of the following?: none Diabetic:: no  Functional Status Activities of Daily Living (to include ambulation/medication): Independent Ambulation: Independent with device- listed below Home Assistive Devices/Equipment: Contact lenses; Eyeglasses Medication Administration: Independent Home Management (perform basic housework or laundry): Independent Manage your own finances?: yes Primary transportation is: driving Concerns about vision?: no *vision screening is required for WTM* Concerns about hearing?: no  Fall Screening Falls in the past year?: 0 Number of falls in past year: 0 Was there an injury with Fall?: 0 Fall Risk Category Calculator: 0 Patient Fall Risk Level: Low Fall Risk  Fall Risk Patient at Risk for Falls Due to: No Fall Risks Fall risk Follow up: Falls evaluation completed; Falls prevention discussed  Home and Transportation Safety: All rugs have non-skid backing?: N/A, no rugs All stairs or steps have railings?: yes (outside) Grab bars in the bathtub or shower?: yes Have non-skid surface in bathtub or shower?: yes Good home lighting?: yes Regular seat belt use?: yes Hospital stays in the last year:: (!) yes How many hospital stays:: 1 Reason: TIA  Cognitive Assessment Difficulty concentrating, remembering, or making decisions? : no Will 6CIT or Mini Cog be Completed: yes What year is it?: 0 points What month is it?: 0 points Give patient an address phrase to remember (5 components): [address]  [place], [place] About what time is it?: 0 points Count backwards from 20 to 1: 0 points Say the months of the year in reverse: 0 points Repeat the address  phrase from earlier: 0 points 6 CIT Score: 0  points  Advance Directives (For Healthcare) Does Patient Have a Medical Advance Directive?: No Does patient want to make changes to medical advance directive?: No - Patient declined Type of Advance Directive: Healthcare Power of [place]; Living will Copy of Healthcare Power of Attorney in Chart?: No - copy requested Copy of Living Will in Chart?: No - copy requested Would patient like information on creating a medical advance directive?: Yes (Inpatient - patient requests chaplain consult to create a medical advance directive); No - Patient declined  Reviewed/Updated  Reviewed/Updated: Reviewed All (Medical, Surgical, Family, Medications, Allergies, Care Teams, Patient Goals)    Allergies (verified) Patient has no known allergies.   Current Medications (verified) Outpatient Encounter Medications as of 06/03/2024  Medication Sig   atorvastatin  (LIPITOR) 40 MG tablet Take 1 tablet (40 mg total) by mouth daily.   carvedilol (COREG) 3.125 MG tablet Take 3.125 mg by mouth 2 (two) times daily with a meal.   cholecalciferol (VITAMIN D3) 25 MCG (1000 UNIT) tablet Take 1,000 Units by mouth daily.   LIVDELZI 10 MG CAPS Take 1 capsule by mouth daily.   ursodiol (ACTIGALL) 250 MG tablet Take 250 mg by mouth 2 (two) times daily. 2 tablets in the morning and 1 tablet in the evening   vitamin B-12 (CYANOCOBALAMIN ) 100 MCG tablet Take 100 mcg by mouth daily.   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   Past Surgical History:  Procedure Laterality Date   COLONOSCOPY  2011-[facility]   LIVER BIOPSY     Family History  Problem Relation Age of  Onset   Heart disease Mother    Stroke Mother    Diabetes Father    Diabetes Brother    Colon cancer Paternal Aunt        43's at dx   Heart disease Maternal Grandmother    Diabetes Paternal Grandmother    Esophageal cancer Neg Hx    Rectal cancer Neg Hx    Stomach cancer Neg Hx    Colon polyps Neg Hx    Pancreatic cancer Neg Hx    Social History   Occupational History   Not on file  Tobacco Use   Smoking status: Never   Smokeless tobacco: Never  Vaping Use   Vaping status: Never Used  Substance and Sexual Activity   Alcohol use: No   Drug use: No   Sexual activity: Yes   Tobacco Counseling Counseling given: Not Answered  SDOH Screenings   Food Insecurity: No Food Insecurity (06/03/2024)  Housing: Unknown (06/03/2024)  Recent Concern: Housing - High Risk (05/04/2024)  Transportation Needs: No Transportation Needs (06/03/2024)  Utilities: Not At Risk (06/03/2024)  Depression (PHQ2-9): Low Risk  (06/03/2024)  Financial Resource Strain: Low Risk  (04/11/2024)  Physical Activity: Sufficiently Active (06/03/2024)  Social Connections: Moderately Integrated (06/03/2024)  Stress: No Stress Concern Present (06/03/2024)  Tobacco Use: Low Risk  (06/03/2024)  Health Literacy: Adequate Health Literacy (06/03/2024)   See flowsheets for full screening details  Depression Screen PHQ  2 & 9 Depression Scale- Over the past 2 weeks, how often have you been bothered by any of the following problems? Little interest or pleasure in doing things: 0 Feeling down, depressed, or hopeless (PHQ Adolescent also includes...irritable): 0 PHQ-2 Total Score: 0 Trouble falling or staying asleep, or sleeping too much: 0 Feeling tired or having little energy: 0 Poor appetite or overeating (PHQ Adolescent also includes...weight loss): 0 Feeling bad about yourself - or that you are a failure or have let yourself or your family down: 0 Trouble concentrating on things, such as reading the newspaper or watching  television (PHQ Adolescent also includes...like school work): 0 Moving or speaking so slowly that other people could have noticed. Or the opposite - being so fidgety or restless that you have been moving around a lot more than usual: 0 Thoughts that you would be better off dead, or of hurting yourself in some way: 0 PHQ-9 Total Score: 0 If you checked off any problems, how difficult have these problems made it for you to do your work, take care of things at home, or get along with other people?: Not difficult at all  Depression Treatment Depression Interventions/Treatment : EYV7-0 Score <4 Follow-up Not Indicated     Goals Addressed               This Visit's Progress     Patient Stated (pt-stated)        Patient stated she plans to continue staying active and monitor her bp readings             Objective:    Today's Vitals   06/03/24 1135  Weight: 125 lb (56.7 kg)  Height: 5' 1 (1.549 m)   Body mass index is 23.62 kg/m.  Hearing/Vision screen Hearing Screening - Comments:: Denies hearing difficulties   Vision Screening - Comments:: Wears rx glasses &amp; contact lenses - up to date with routine eye exams with [facility] Immunizations and Health Maintenance Health Maintenance  Topic Date Due   Mammogram  08/31/2022   Zoster Vaccines- Shingrix (1 of 2) 07/13/2024 (Originally 05/26/2005)   Influenza Vaccine  09/27/2024 (Originally 01/29/2024)   Pneumococcal Vaccine: 50+ Years (1 of 2 - PCV) 04/12/2025 (Originally 05/26/1975)   COVID-19 Vaccine (4 - 2025-26 season) 04/28/2027 (Originally 02/29/2024)   Medicare Annual Wellness (AWV)  06/03/2025   Colonoscopy  10/05/2030   DTaP/Tdap/Td (2 - Td or Tdap) 07/12/2031   Bone Density Scan  Completed   Hepatitis C Screening  Completed   Meningococcal B Vaccine  Aged Out   Hepatitis B Vaccines 19-59 Average Risk  Discontinued        Assessment/Plan:  This is a routine wellness examination for [place].  .I have  recommended that this patient have a immunization for Influenza, Pneumonia, and Shingles but she declines at this time. I have discussed the risks and benefits of this procedure with her. The patient verbalizes understanding.   Mammogram status: appt scheduled for 06/09/2024  Patient Care Team: Vincente Shivers, NP as PCP - General (General Practice) Babara Call, MD as Consulting Physician (Hematology and Oncology) Center, [facility] (Ophthalmology)  I have personally reviewed and noted the following in the patient's chart:   Medical and social history Use of alcohol, tobacco or illicit drugs  Current medications and supplements including opioid prescriptions. Functional ability and status Nutritional status Physical activity Advanced directives List of other physicians Hospitalizations, surgeries, and ER visits in previous 12 months Vitals Screenings to include cognitive,  depression, and falls Referrals and appointments  No orders of the defined types were placed in this encounter.  In addition, I have reviewed and discussed with patient certain preventive protocols, quality metrics, and best practice recommendations. A written personalized care plan for preventive services as well as general preventive health recommendations were provided to patient.   Verdie CHRISTELLA Saba, CMA   06/03/2024   Return in 1 year (on 06/03/2025).  After Visit Summary: (MyChart) Due to this being a telephonic visit, the after visit summary with patients personalized plan was offered to patient via MyChart   Nurse Notes: Scheduled 2026 AWV appt

## 2024-06-09 ENCOUNTER — Encounter

## 2024-06-09 ENCOUNTER — Ambulatory Visit
Admission: RE | Admit: 2024-06-09 | Discharge: 2024-06-09 | Disposition: A | Discharge status: Home / Self Care | Source: Ambulatory Visit | Attending: General Practice | Admitting: General Practice

## 2024-06-09 DIAGNOSIS — Z1231 Encounter for screening mammogram for malignant neoplasm of breast: Secondary | ICD-10-CM | POA: Insufficient documentation

## 2024-06-15 ENCOUNTER — Ambulatory Visit: Payer: Self-pay | Admitting: General Practice

## 2024-06-15 ENCOUNTER — Other Ambulatory Visit: Payer: Self-pay | Admitting: General Practice

## 2024-06-15 DIAGNOSIS — R928 Other abnormal and inconclusive findings on diagnostic imaging of breast: Secondary | ICD-10-CM

## 2024-06-21 ENCOUNTER — Inpatient Hospital Stay

## 2024-06-21 ENCOUNTER — Telehealth: Payer: Self-pay

## 2024-06-21 ENCOUNTER — Inpatient Hospital Stay: Attending: Oncology | Admitting: Oncology

## 2024-06-21 ENCOUNTER — Encounter: Payer: Self-pay | Admitting: Oncology

## 2024-06-21 DIAGNOSIS — D696 Thrombocytopenia, unspecified: Secondary | ICD-10-CM | POA: Insufficient documentation

## 2024-06-21 DIAGNOSIS — D509 Iron deficiency anemia, unspecified: Secondary | ICD-10-CM

## 2024-06-21 DIAGNOSIS — K743 Primary biliary cirrhosis: Secondary | ICD-10-CM | POA: Diagnosis not present

## 2024-06-21 DIAGNOSIS — D61818 Other pancytopenia: Secondary | ICD-10-CM | POA: Diagnosis present

## 2024-06-21 DIAGNOSIS — D72820 Lymphocytosis (symptomatic): Secondary | ICD-10-CM | POA: Diagnosis not present

## 2024-06-21 DIAGNOSIS — L930 Discoid lupus erythematosus: Secondary | ICD-10-CM | POA: Diagnosis not present

## 2024-06-21 DIAGNOSIS — D72819 Decreased white blood cell count, unspecified: Secondary | ICD-10-CM | POA: Diagnosis not present

## 2024-06-21 NOTE — Assessment & Plan Note (Signed)
 Chronic thrombocytopenia, likely secondary to cirrhosis. Adequate B12, folate.  Pending bone marrow biopsy evaluation

## 2024-06-21 NOTE — Telephone Encounter (Signed)
 Request for Bone marrow biopsy sent to IR. Date pending.   Pt will follow up 2 after Bmbx.

## 2024-06-21 NOTE — Assessment & Plan Note (Addendum)
 Chronic microcytic anemia.   Patient has required iron level, B12 and folate. Check hemoglobinopathy evaluation, alpha thalassemia analysis, parvovirus copper  level. Patient has multiple lines of cytopenia.  Hemoglobin progressively decreases.  Probably anemia secondary to chronic inflammation. recommend bone marrow biopsy evaluation to rule out underlying bone marrow disorders she agrees with the plan.

## 2024-06-21 NOTE — Progress Notes (Signed)
 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 Rule out other etiologies.  Workup showed adequate B12, folate, negative M protein on protein after pheresis, normal light chain ratio  Repeat blood work showed normal white count with no abnormality in differentials.  Monoclonal lymphocytosis of unknown significance.  Observation  Microcytic anemia Chronic microcytic anemia.   Patient has required iron level, B12 and folate. Check hemoglobinopathy evaluation, alpha thalassemia analysis, parvovirus copper  level. Patient has multiple lines of cytopenia.  Hemoglobin progressively decreases.  Probably anemia secondary to chronic inflammation. recommend bone marrow biopsy evaluation to rule out underlying bone marrow disorders she agrees with the plan.  Thrombocytopenia Chronic thrombocytopenia, likely secondary to cirrhosis. Adequate B12, folate.  Pending bone marrow biopsy evaluation   Orders Placed This Encounter  Procedures   IR BONE MARROW BIOPSY & ASPIRATION    Standing Status:   Future    Expected Date:   06/28/2024    Expiration Date:   06/21/2025    Reason for Exam (SYMPTOM  OR DIAGNOSIS REQUIRED):   Leukopenia    Preferred Imaging Location?:   [facility]   Hgb Fractionation [place]    Standing Status:   Future    Number of Occurrences:   1    Expected Date:   06/21/2024    Expiration Date:   06/21/2025   Alpha-Thalassemia Analysis    Standing Status:   Future    Number of Occurrences:   1    Expected Date:   06/21/2024    Expiration Date:   06/21/2025    Indication:   microcytic anemia.    Human parvovirus DNA detection by PCR    Standing Status:   Future    Number of Occurrences:   1    Expected Date:   06/21/2024    Expiration Date:   06/21/2025   Copper , serum    Standing Status:   Future    Number of Occurrences:   1    Expected Date:   06/21/2024    Expiration Date:   06/21/2025   Follow-up after pulmonary biopsy All questions were answered. The patient knows to call the clinic with any problems, questions or concerns.  Zelphia Cap, MD, PhD [facility] Hematology Oncology 06/21/2024   HISTORY OF PRESENTING ILLNESS:   Valerie Rojas is a  68 y.o.  female with PMH listed below was seen in consultation at the request of  Vincente Shivers, NP  for evaluation of pancytopenia  Discussed the use of AI scribe software for clinical note transcription with the patient, who gave verbal consent to proceed.   She has a history of primary biliary cholangitis (PBC) and liver cirrhosis, with associated portal hypertension. She takes Carvedilol twice daily for portal hypertension. Her liver specialist monitors her every six months with full liver screening and imaging. She has been on ursodiol for years, which maintained her enzyme levels, but switched to [place] after experiencing bruising with [place] .  She has chronic pancytopenia.   She currently takes ursodiol 300 mg three times a day and Livdelzy without side effects.  Recently, she experienced a minor transient ischemic attack (TIA) on April 24, 2024,  and was treated in the emergency department. 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 She was started on Plavix  and a cholesterol-lowering medication.   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 She reports stable weight and good appetite. No excessive night sweats. She has noticed skin discoloration on her trunk  INTERVAL HISTORY Valerie Rojas is a 68 y.o.  female who has above history reviewed by me today presents for follow up visit for anemia, leukopenia, thrombocytopenia.  Patient reports feeling well at baseline today.  Chronic fatigue.  No new complaints.  Appetite is fair.  Weight is stable.  MEDICAL HISTORY:  Past Medical History:  Diagnosis Date   Anemia    pt reports a while back   Discoid lupus    Hyperlipidemia    high level on meds to prevent getting worse   Hypertension    Liver disease    Other specified disorders of liver    In process of dx issues with liver/bile duct    SURGICAL HISTORY: Past Surgical History:  Procedure Laterality Date   COLONOSCOPY  2011-[facility]   LIVER BIOPSY      SOCIAL HISTORY: Social History   Socioeconomic History   Marital status: Widowed    Spouse name: Not on file   Number of children: Not on file   Years of education: Not on file   Highest education level: GED or equivalent  Occupational History   Not on file  Tobacco Use   Smoking status: Never   Smokeless tobacco: Never  Vaping Use   Vaping status: Never Used  Substance and Sexual Activity   Alcohol use: No   Drug use: No   Sexual activity: Yes  Other Topics Concern   Not on file  Social History Narrative   widowed   Social Drivers of Health   Tobacco Use: Low Risk (06/21/2024)   Patient History    Smoking Tobacco Use: Never    Smokeless Tobacco Use: Never    Passive Exposure: Not on file  Financial Resource Strain: Low Risk  (06/09/2024)   Received from [facility]   Overall Financial Resource Strain (CARDIA)    Difficulty of Paying Living Expenses: Not hard at all  Food Insecurity: No Food Insecurity (06/09/2024)   Received from [facility]   Epic    Within the past 12 months, you worried that your food would run out before you got the money to buy more.: Never true    Within the past 12 months, the food you bought just didn't last and you didn't have money to get more.:  Never true  Transportation Needs: No Transportation Needs (06/09/2024)   Received from [facility] - Transportation    In the past 12 months, has lack of transportation kept you from medical appointments or from getting medications?: No    Lack of Transportation (Non-Medical): No  Physical Activity: Sufficiently Active (06/03/2024)   Exercise Vital Sign    Days of Exercise per Week: 5 days    Minutes of Exercise per Session: 30 min  Stress: No Stress Concern Present (06/03/2024)   [employer] of [employer]ational [employer] - Occupational Stress Questionnaire    Feeling of Stress: Not at all  Social Connections: Moderately Integrated (06/03/2024)   Social Connection and Isolation Panel    Frequency of  Communication with Friends and Family: More than three times a week    Frequency of Social Gatherings with Friends and Family: Once a week    Attends Religious Services: 1 to 4 times per year    Active Member of [employer] or Organizations: Yes    Attends Banker Meetings: 1 to 4 times per year    Marital Status: Widowed  Intimate Partner Violence: Not At Risk (06/03/2024)   Epic    Fear of Current or Ex-Partner: No    Emotionally Abused: No    Physically Abused: No    Sexually Abused: No  Depression (PHQ2-9): Low Risk (06/03/2024)   Depression (PHQ2-9)    PHQ-2 Score: 0  Alcohol Screen: Not on file  Housing: Unknown (06/09/2024)   Received from [facility]   Epic    In the last 12 months, was there a time when you were not able to pay the mortgage or rent on time?: No    Number of Times Moved in the Last Year: Not on file    At any time in the past 12 months, were you homeless or living in a shelter (including now)?: No  Recent Concern: Housing - High Risk (05/04/2024)   Epic    Unable to Pay for Housing in the Last Year: Yes    Number of Times Moved in the Last Year: 0    Homeless in the Last Year: No  Utilities: Not At Risk  (06/09/2024)   Received from [facility]   Epic    In the past 12 months has the electric, gas, oil, or water company threatened to shut off services in your home?: No  Health Literacy: Adequate Health Literacy (06/03/2024)   B1300 Health Literacy    Frequency of need for help with medical instructions: Never    FAMILY HISTORY: Family History  Problem Relation Age of Onset   Heart disease Mother    Stroke Mother    Diabetes Father    Diabetes Brother    Colon cancer Paternal Aunt        50's at dx   Heart disease Maternal Grandmother    Diabetes Paternal Grandmother    Esophageal cancer Neg Hx    Rectal cancer Neg Hx    Stomach cancer Neg Hx    Colon polyps Neg Hx    Pancreatic cancer Neg Hx     ALLERGIES:  has no known allergies.  MEDICATIONS:  Current Outpatient Medications  Medication Sig Dispense Refill   atorvastatin  (LIPITOR) 40 MG tablet Take 1 tablet (40 mg total) by mouth daily. 30 tablet 2   carvedilol (COREG) 3.125 MG tablet Take 3.125 mg by mouth 2 (two) times daily with a meal.     cholecalciferol (VITAMIN D3) 25 MCG (1000 UNIT) tablet Take 1,000 Units by mouth daily.     LIVDELZI 10 MG CAPS Take 1 capsule by mouth daily.     ursodiol (ACTIGALL) 250 MG tablet Take 250 mg by mouth 2 (two) times daily. 2 tablets in the morning and 1 tablet in the evening     vitamin B-12 (CYANOCOBALAMIN ) 100 MCG tablet Take 100 mcg by mouth daily. (Patient not taking: Reported on 06/21/2024)     Current Facility-Administered Medications  Medication Dose Route Frequency Provider Last Rate Last Admin   0.9 %  sodium chloride  infusion  500 mL Intravenous Once Beavers, Kimberly, MD        Review of Systems  Constitutional:  Negative for appetite change, chills, fatigue and fever.  HENT:   Negative for hearing loss and voice change.   Eyes:  Negative for eye problems.  Respiratory:  Negative for chest tightness and cough.   Cardiovascular:  Negative for chest  pain.  Gastrointestinal:  Negative for abdominal distention, abdominal pain and blood in stool.  Endocrine: Negative for hot flashes.  Genitourinary:  Negative for difficulty urinating and frequency.   Musculoskeletal:  Negative for arthralgias.  Skin:  Negative for itching and rash.  Neurological:  Negative for extremity weakness.  Hematological:  Negative for adenopathy.  Psychiatric/Behavioral:  Negative for confusion.    PHYSICAL EXAMINATION:  Vitals:   06/21/24 1308  BP: 130/66  Pulse: 64  Resp: 20  Temp: 98.6 F (37 C)  SpO2: 100%   Filed Weights   06/21/24 1308  Weight: 126 lb (57.2 kg)    Physical Exam Constitutional:      General: She is not in acute distress. HENT:     Head: Normocephalic and atraumatic.  Eyes:     General: No scleral icterus. Cardiovascular:     Rate and Rhythm: Normal rate and regular rhythm.     Heart sounds: Normal heart sounds.  Pulmonary:     Effort: Pulmonary effort is normal. No respiratory distress.     Breath sounds: No wheezing.  Abdominal:     General: Bowel sounds are normal. There is no distension.     Palpations: Abdomen is soft.  Musculoskeletal:        General: No deformity. Normal range of motion.     Cervical back: Normal range of motion and neck supple.  Skin:    General: Skin is warm and dry.     Findings: No erythema or rash.     Comments: Areas of hyperpigmentation on her trunk  Neurological:     Mental Status: She is alert and oriented to person, place, and time. Mental status is at baseline.     Cranial Nerves: No cranial nerve deficit.     Coordination: Coordination normal.  Psychiatric:        Mood and Affect: Mood normal.     LABORATORY DATA:  I have reviewed the data as listed    Latest Ref Rng & Units 05/04/2024    3:15 PM 04/24/2024    4:10 PM 04/28/2022    2:56 PM  CBC  WBC 4.0 - 10.5 K/uL 4.8  3.8  4.4   Hemoglobin 12.0 - 15.0 g/dL 9.5  9.9  89.9   Hematocrit 36.0 - 46.0 % 29.8  31.8  31.3    Platelets 150 - 400 K/uL 133  104  145.0       Latest Ref Rng & Units 04/24/2024    4:10 PM 04/28/2022    2:56 PM 05/19/2018   10:38 AM  CMP  Glucose 70 - 99 mg/dL 894  95  885   BUN 8 - 23 mg/dL 18  15  16    Creatinine 0.44 - 1.00 mg/dL 9.00  9.35  9.28   Sodium 135 - 145 mmol/L 142  138  138   Potassium 3.5 - 5.1 mmol/L 3.6  4.0  3.9   Chloride 98 - 111 mmol/L 104  103  101   CO2 22 - 32 mmol/L 25  26  28    Calcium  8.9 - 10.3 mg/dL 9.6  9.7  89.2   Total Protein 6.5 - 8.1 g/dL 8.4  8.0  9.0   Total Bilirubin  0.0 - 1.2 mg/dL 1.3  0.6  0.7   Alkaline Phos 38 - 126 U/L 275  379  312   AST 15 - 41 U/L 50  48  51   ALT 0 - 44 U/L 32  45  57       RADIOGRAPHIC STUDIES: I have personally reviewed the radiological images as listed and agreed with the findings in the report. MM 3D SCREENING MAMMOGRAM BILATERAL BREAST Result Date: 06/15/2024 CLINICAL DATA:  Screening. EXAM: DIGITAL SCREENING BILATERAL MAMMOGRAM WITH TOMOSYNTHESIS AND CAD TECHNIQUE: Bilateral screening digital craniocaudal and mediolateral oblique mammograms were obtained. Bilateral screening digital breast tomosynthesis was performed. The images were evaluated with computer-aided detection. COMPARISON:  Previous exam(s). ACR Breast Density Category c: The breasts are heterogeneously dense, which may obscure small masses. FINDINGS: In the right breast, calcifications about the upper central breast, posterior third, warrant further evaluation with magnified views. In the left breast, no findings suspicious for malignancy. IMPRESSION: Further evaluation is suggested for calcifications in the right breast. RECOMMENDATION: Diagnostic mammogram of the right breast. (Code:[id number]) The patient will be contacted regarding the findings, and additional imaging will be scheduled. BI-RADS CATEGORY  0: Incomplete: Need additional imaging evaluation. Electronically Signed   By: Curtistine Noble   On: 06/15/2024 13:22   ECHOCARDIOGRAM  COMPLETE Result Date: 04/25/2024    ECHOCARDIOGRAM REPORT   Patient Name:   JARED CAHN Date of Exam: 04/25/2024 Medical Rec #:  [id number]      Height:       62.0 in Accession #:    [id number]     Weight:       129.4 lb Date of Birth:  [birth year]     BSA:          1.589 m Patient Age:    67 years       BP:           144/63 mmHg Patient Gender: F              HR:           65 bpm. Exam Location:  [facility] Procedure: 2D Echo, Cardiac Doppler and Color Doppler (Both Spectral and Color            Flow Doppler were utilized during procedure). Indications:     Stroke I63.9  History:         Patient has no prior history of Echocardiogram examinations.                  Stroke.  Sonographer:     Rosina Dunk Referring Phys:  [id number] DELAYNE LULLA SOLIAN Diagnosing Phys: Deatrice Cage MD  Sonographer Comments: Image acquisition challenging due to respiratory motion. IMPRESSIONS  1. Left ventricular ejection fraction, by estimation, is 60 to 65%. The left ventricle has normal function. The left ventricle has no regional wall motion abnormalities. Left ventricular diastolic parameters were normal.  2. Right ventricular systolic function is normal. The right ventricular size is normal. Tricuspid regurgitation signal is inadequate for assessing PA pressure.  3. The mitral valve is normal in structure. Mild to moderate mitral valve regurgitation. No evidence of mitral stenosis.  4. The aortic valve is normal in structure. Aortic valve regurgitation is not visualized. Aortic valve sclerosis is present, with no evidence of aortic valve stenosis.  5. The inferior vena cava is normal in size with greater than 50% respiratory variability, suggesting right atrial pressure of 3 mmHg. FINDINGS  Left Ventricle: Left ventricular ejection  fraction, by estimation, is 60 to 65%. The left ventricle has normal function. The left ventricle has no regional wall motion abnormalities. The left ventricular internal cavity size was normal in  size. There is  no left ventricular hypertrophy. Left ventricular diastolic parameters were normal. Right Ventricle: The right ventricular size is normal. No increase in right ventricular wall thickness. Right ventricular systolic function is normal. Tricuspid regurgitation signal is inadequate for assessing PA pressure. Left Atrium: Left atrial size was normal in size. Right Atrium: Right atrial size was normal in size. Pericardium: There is no evidence of pericardial effusion. Mitral Valve: The mitral valve is normal in structure. Mild to moderate mitral valve regurgitation, with posteriorly-directed jet. No evidence of mitral valve stenosis. MV peak gradient, 4.6 mmHg. The mean mitral valve gradient is 2.0 mmHg. Tricuspid Valve: The tricuspid valve is normal in structure. Tricuspid valve regurgitation is not demonstrated. No evidence of tricuspid stenosis. Aortic Valve: The aortic valve is normal in structure. Aortic valve regurgitation is not visualized. Aortic valve sclerosis is present, with no evidence of aortic valve stenosis. Aortic valve mean gradient measures 3.0 mmHg. Aortic valve peak gradient measures 5.4 mmHg. Aortic valve area, by VTI measures 1.68 cm. Pulmonic Valve: The pulmonic valve was normal in structure. Pulmonic valve regurgitation is not visualized. No evidence of pulmonic stenosis. Aorta: The aortic root is normal in size and structure. Venous: The inferior vena cava is normal in size with greater than 50% respiratory variability, suggesting right atrial pressure of 3 mmHg. IAS/Shunts: No atrial level shunt detected by color flow Doppler.  LEFT VENTRICLE PLAX 2D LVIDd:         4.20 cm     Diastology LVIDs:         2.90 cm     LV e' medial:    8.16 cm/s LV PW:         0.90 cm     LV E/e' medial:  11.4 LV IVS:        0.90 cm     LV e' lateral:   9.03 cm/s LVOT diam:     1.70 cm     LV E/e' lateral: 10.3 LV SV:         48 LV SV Index:   30 LVOT Area:     2.27 cm  LV Volumes (MOD) LV vol d,  MOD A2C: 59.3 ml LV vol d, MOD A4C: 67.2 ml LV vol s, MOD A2C: 20.7 ml LV vol s, MOD A4C: 28.3 ml LV SV MOD A2C:     38.6 ml LV SV MOD A4C:     67.2 ml LV SV MOD BP:      39.7 ml RIGHT VENTRICLE             IVC RV Basal diam:  3.00 cm     IVC diam: 1.40 cm RV Mid diam:    2.10 cm RV S prime:     10.70 cm/s TAPSE (M-mode): 2.2 cm LEFT ATRIUM             Index        RIGHT ATRIUM          Index LA diam:        3.50 cm 2.20 cm/m   RA Area:     9.39 cm LA Vol (A2C):   75.5 ml 47.52 ml/m  RA Volume:   15.50 ml 9.76 ml/m LA Vol (A4C):   47.2 ml 29.71 ml/m LA Biplane Vol: 62.4  ml 39.28 ml/m  AORTIC VALVE                    PULMONIC VALVE AV Area (Vmax):    1.79 cm     PV Vmax:        0.90 m/s AV Area (Vmean):   1.68 cm     PV Vmean:       66.900 cm/s AV Area (VTI):     1.68 cm     PV VTI:         0.216 m AV Vmax:           116.00 cm/s  PV Peak grad:   3.3 mmHg AV Vmean:          81.400 cm/s  PV Mean grad:   2.0 mmHg AV VTI:            0.283 m      RVOT Peak grad: 1 mmHg AV Peak Grad:      5.4 mmHg AV Mean Grad:      3.0 mmHg LVOT Vmax:         91.70 cm/s LVOT Vmean:        60.400 cm/s LVOT VTI:          0.210 m LVOT/AV VTI ratio: 0.74  AORTA Ao Root diam: 2.60 cm MITRAL VALVE MV Area (PHT): 3.72 cm       SHUNTS MV Area VTI:   1.58 cm       Systemic VTI:  0.21 m MV Peak grad:  4.6 mmHg       Systemic Diam: 1.70 cm MV Mean grad:  2.0 mmHg       Pulmonic VTI:  0.109 m MV Vmax:       1.07 m/s MV Vmean:      73.7 cm/s MV Decel Time: 204 msec MR Peak grad:    85.4 mmHg MR Vmax:         462.00 cm/s MR PISA:         1.57 cm MR PISA Eff ROA: 13 mm MR PISA Radius:  0.50 cm MV E velocity: 93.40 cm/s MV A velocity: 69.40 cm/s MV E/A ratio:  1.35 Deatrice Cage MD Electronically signed by Deatrice Cage MD Signature Date/Time: 04/25/2024/9:15:40 AM    Final    MR BRAIN WO CONTRAST Result Date: 04/24/2024 EXAM: MRI BRAIN WITHOUT CONTRAST 04/24/2024 07:55:58 PM TECHNIQUE: Multiplanar multisequence MRI of the head/brain  was performed without the administration of intravenous contrast. COMPARISON: CTA head and neck same day CLINICAL HISTORY: Intermittent vertigo, elevated BPs. Pt arrives via [facility] from home for a near syncope episode. FINDINGS: BRAIN AND VENTRICLES: No acute infarct. No intracranial hemorrhage. No mass. No midline shift. No hydrocephalus. The sella is unremarkable. Normal flow voids. ORBITS: No acute abnormality. SINUSES AND MASTOIDS: No acute abnormality. BONES AND SOFT TISSUES: Normal marrow signal. No acute soft tissue abnormality. IMPRESSION: 1. No acute intracranial abnormality. Electronically signed by: Gilmore Molt MD 04/24/2024 08:00 PM EDT RP Workstation: [id number]   CT ANGIO HEAD NECK W WO CM Result Date: 04/24/2024 EXAM: CT HEAD WITHOUT CTA HEAD AND NECK WITH AND WITHOUT 04/24/2024 05:58:37 PM TECHNIQUE: CTA of the head and neck was performed with and without the administration of intravenous contrast. Noncontrast CT of the head with reconstructed 2-D images are also provided for review. Multiplanar 2D and/or 3D reformatted images are provided for review. Automated exposure control, iterative reconstruction, and/or weight based adjustment of the mA/kV was utilized  to reduce the radiation dose to as low as reasonably achievable. COMPARISON: CT head without contrast 08/25/2007. CLINICAL HISTORY: FINDINGS: CT HEAD: BRAIN AND VENTRICLES: No acute intracranial hemorrhage. No mass effect or midline shift. No extra-axial fluid collection. Gray-white differentiation is maintained. No hydrocephalus. ORBITS: No acute abnormality. SINUSES: No acute abnormality. SOFT TISSUES AND SKULL: No acute abnormality. CTA NECK: AORTIC ARCH AND ARCH VESSELS: Common origin of the left common carotid artery and the innominate artery is noted. Atherosclerotic changes are present in the distal aortic arch. No aneurysm or stenosis is present. No dissection or arterial injury. No significant stenosis of the brachiocephalic or  subclavian arteries. CERVICAL CAROTID ARTERIES: Atherosclerotic changes are at the left carotid bifurcation proximal ICA. The minimal luminal diameter is 2.1 mm. As compared to distal left ICA lumen of 3.7 mm. This results in 45% stenosis of the left internal carotid artery at the bifurcation by NASCET criteria. No dissection, arterial injury, or hemodynamically significant stenosis by NASCET criteria. CERVICAL VERTEBRAL ARTERIES: Vertebral artery is slightly dominant. No dissection, arterial injury, or significant stenosis. VISUALIZED LUNGS AND MEDIASTINUM: Unremarkable. SOFT TISSUES: No acute abnormality. BONES: No acute abnormality. CTA HEAD: ANTERIOR CIRCULATION: No significant stenosis of the internal carotid arteries. Mild narrowing is present in the proximal left A1 segment. Moderate stenosis is present in the distal right M1 segment. No aneurysm. POSTERIOR CIRCULATION: Mild narrowing is present in the left V4 segment. High-grade stenosis and occlusion is present in the distal left posterior cerebral artery P3 segment. No significant stenosis of the basilar artery. No aneurysm. OTHER: No dural venous sinus thrombosis on this non-dedicated study. IMPRESSION: 1. No acute intracranial abnormality. 2. 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 3. 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 4. 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 Electronically signed by: Lonni Necessary MD 04/24/2024 06:27 PM EDT RP Workstation: [id number]   DG Chest 2 View Result Date: 04/24/2024 CLINICAL DATA:  Shortness of breath. Near syncope. Chest tightness. EXAM: CHEST - 2 VIEW COMPARISON:  01/02/2022 FINDINGS: The heart is mildly enlarged. Mediastinal contours are stable. No pulmonary edema. No focal airspace disease, pleural effusion, or pneumothorax. No acute osseous findings. IMPRESSION: Mild cardiomegaly. Electronically Signed   By: Andrea Gasman M.D.   On: 04/24/2024 18:26         "

## 2024-06-21 NOTE — Assessment & Plan Note (Signed)
Follow up with hepatology.

## 2024-06-21 NOTE — Assessment & Plan Note (Addendum)
 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 Rule out other etiologies.  Workup showed adequate B12, folate, negative M protein on protein after pheresis, normal light chain ratio  Repeat blood work showed normal white count with no abnormality in differentials.  Monoclonal lymphocytosis of unknown significance.  Observation

## 2024-06-23 LAB — HUMAN PARVOVIRUS DNA DETECTION BY PCR: Parvovirus B19, PCR: NEGATIVE

## 2024-06-24 LAB — COPPER, SERUM: Copper: 109 ug/dL (ref 80–158)

## 2024-06-26 LAB — HGB FRACTIONATION CASCADE
Hgb A2: 2.8 % (ref 1.8–3.2)
Hgb A: 97.2 % (ref 96.4–98.8)
Hgb F: 0 % (ref 0.0–2.0)
Hgb S: 0 %

## 2024-06-27 ENCOUNTER — Other Ambulatory Visit: Payer: Self-pay

## 2024-06-27 DIAGNOSIS — Z01818 Encounter for other preprocedural examination: Secondary | ICD-10-CM

## 2024-06-28 ENCOUNTER — Ambulatory Visit
Admission: RE | Admit: 2024-06-28 | Discharge: 2024-06-28 | Disposition: A | Discharge status: Home / Self Care | Source: Ambulatory Visit | Attending: General Practice | Admitting: General Practice

## 2024-06-28 ENCOUNTER — Telehealth: Payer: Self-pay

## 2024-06-28 DIAGNOSIS — R928 Other abnormal and inconclusive findings on diagnostic imaging of breast: Secondary | ICD-10-CM | POA: Diagnosis present

## 2024-06-28 NOTE — H&P (Signed)
 "   Chief Complaint: Pancytopenia  Referring Provider(s): Yu,Zhou  Supervising Physician: Karalee Beat  Patient Status: [facility] - Out-pt  History of Present Illness: Valerie Rojas is a 68 y.o. 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  She was referred to Dr. Babara for workup for pancytopenia.  A bone marrow biopsy has been requested to rule out underlying bone marrow disorders.  Confirms NPO since MN and ride/supervision available for 24 hours.  Does not wear CPAP or use supplemental home O2.  Denies fever, chills, SOB, CP, sore throat, N/V, abd pain, blood in stool or urine.  She notes intermittent fatigue that does not significantly impact her day-to-day activities.  She also notes abnormal bruising that has been present intermittently under her bra straps and more recent months, denies extremity bruising.  Allergies Reviewed:  Patient has no known allergies.   Patient is Full Code  Past Medical History:  Diagnosis Date   Anemia    pt reports a while back   Discoid lupus    Hyperlipidemia    high level on meds to prevent getting worse   Hypertension    Liver disease    Other specified disorders of liver    In process of dx issues with liver/bile duct    Past Surgical History:  Procedure Laterality Date   COLONOSCOPY  2011[facility]   LIVER BIOPSY        Medications: Prior to Admission medications  Medication Sig Start Date End Date Taking? Authorizing Provider  atorvastatin  (LIPITOR) 40 MG tablet Take 1 tablet (40 mg total) by mouth daily. 04/25/24   Dorinda Drue DASEN, MD  carvedilol (COREG) 3.125 MG tablet Take 3.125 mg by mouth 2 (two) times daily with a meal. 12/07/23 12/06/24  [provider]  cholecalciferol (VITAMIN D3) 25 MCG (1000 UNIT) tablet Take 1,000 Units by mouth daily.    [provider]  LIVDELZI 10 MG CAPS Take 1 capsule by mouth  daily.    [provider]  ursodiol (ACTIGALL) 250 MG tablet Take 250 mg by mouth 2 (two) times daily. 2 tablets in the morning and 1 tablet in the evening 02/03/21   [provider]  vitamin B-12 (CYANOCOBALAMIN ) 100 MCG tablet Take 100 mcg by mouth daily. Patient not taking: Reported on 06/21/2024    [provider]     Family History  Problem Relation Age of Onset   Heart disease Mother    Stroke Mother    Diabetes Father    Diabetes Brother    Colon cancer Paternal Aunt        42's at dx   Heart disease Maternal Grandmother    Diabetes Paternal Grandmother    Esophageal cancer Neg Hx    Rectal cancer Neg Hx    Stomach cancer Neg Hx    Colon polyps Neg Hx    Pancreatic cancer Neg Hx     Social History   Socioeconomic History   Marital status: Widowed    Spouse name: Not on file   Number of children: Not on file   Years of education: Not on file   Highest education level: GED or equivalent  Occupational History   Not on file  Tobacco Use   Smoking status: Never   Smokeless tobacco: Never  Vaping Use   Vaping status: Never Used  Substance and Sexual Activity   Alcohol use: No   Drug use: No  Sexual activity: Yes  Other Topics Concern   Not on file  Social History Narrative   widowed   Social Drivers of Health   Tobacco Use: Low Risk (07/01/2024)   Patient History    Smoking Tobacco Use: Never    Smokeless Tobacco Use: Never    Passive Exposure: Not on file  Financial Resource Strain: Low Risk  (06/09/2024)   Received from [facility]   Overall Financial Resource Strain (CARDIA)    Difficulty of Paying Living Expenses: Not hard at all  Food Insecurity: No Food Insecurity (06/09/2024)   Received from [facility]   Epic    Within the past 12 months, you worried that your food would run out before you got the money to buy more.: Never true    Within the past 12 months, the food you bought just  didn't last and you didn't have money to get more.: Never true  Transportation Needs: No Transportation Needs (06/09/2024)   Received from [facility] - Transportation    In the past 12 months, has lack of transportation kept you from medical appointments or from getting medications?: No    Lack of Transportation (Non-Medical): No  Physical Activity: Sufficiently Active (06/03/2024)   Exercise Vital Sign    Days of Exercise per Week: 5 days    Minutes of Exercise per Session: 30 min  Stress: No Stress Concern Present (06/03/2024)   [employer] of Occupational [employer] - Occupational Stress Questionnaire    Feeling of Stress: Not at all  Social Connections: Moderately Integrated (06/03/2024)   Social Connection and Isolation Panel    Frequency of Communication with Friends and Family: More than three times a week    Frequency of Social Gatherings with Friends and Family: Once a week    Attends Religious Services: 1 to 4 times per year    Active Member of [employer] or Organizations: Yes    Attends Banker Meetings: 1 to 4 times per year    Marital Status: Widowed  Depression (PHQ2-9): Low Risk (06/03/2024)   Depression (PHQ2-9)    PHQ-2 Score: 0  Alcohol Screen: Not on file  Housing: Unknown (06/09/2024)   Received from [facility]   Epic    In the last 12 months, was there a time when you were not able to pay the mortgage or rent on time?: No    Number of Times Moved in the Last Year: Not on file    At any time in the past 12 months, were you homeless or living in a shelter (including now)?: No  Recent Concern: Housing - High Risk (05/04/2024)   Epic    Unable to Pay for Housing in the Last Year: Yes    Number of Times Moved in the Last Year: 0    Homeless in the Last Year: No  Utilities: Not At Risk (06/09/2024)   Received from [facility]   Epic    In the past 12 months has the electric, gas, oil, or water  company threatened to shut off services in your home?: No  Health Literacy: Adequate Health Literacy (06/03/2024)   B1300 Health Literacy    Frequency of need for help with medical instructions: Never     Review of Systems: A 12 point ROS discussed and pertinent positives are indicated in the HPI above.  All other systems are negative.    Vital Signs: BP ROLLEN)  176/98   Pulse 75   Temp 97.9 F (36.6 C) (Oral)   Resp 10   Ht 5' 1 (1.549 m)   Wt 129 lb (58.5 kg)   SpO2 100%   BMI 24.37 kg/m   Advance Care Plan: No documents on file   Physical Exam HENT:     Mouth/Throat:     Mouth: Mucous membranes are moist.     Pharynx: Oropharynx is clear.  Cardiovascular:     Rate and Rhythm: Normal rate and regular rhythm.     Pulses: Normal pulses.     Heart sounds: Normal heart sounds.  Pulmonary:     Effort: Pulmonary effort is normal.     Breath sounds: Normal breath sounds.  Musculoskeletal:     Right lower leg: No edema.     Left lower leg: No edema.  Skin:    General: Skin is warm and dry.     Comments: No rash or wound over planned puncture site.  Skin darkening noted at location of bra straps of the anterior chest by patient and ROS.  Neurological:     Mental Status: She is alert and oriented to person, place, and time.  Psychiatric:        Mood and Affect: Mood normal.        Behavior: Behavior normal.        Thought Content: Thought content normal.        Judgment: Judgment normal.     Imaging: MM 3D DIAGNOSTIC MAMMOGRAM UNILATERAL RIGHT BREAST Result Date: 06/28/2024 CLINICAL DATA:  Screening recall RIGHT breast calcifications EXAM: DIGITAL DIAGNOSTIC UNILATERAL RIGHT MAMMOGRAM WITH TOMOSYNTHESIS AND CAD TECHNIQUE: Right digital diagnostic mammography and breast tomosynthesis was performed. The images were evaluated with computer-aided detection. COMPARISON:  Previous exam(s). ACR Breast Density Category c: The breasts are heterogeneously dense, which may obscure  small masses. FINDINGS: Full field lateral and spot magnification view(s) were performed and demonstrate segmental round, bordering on coarse calcifications spanning approximately 4.7 cm at the 12 o'clock position middle to posterior depth in the RIGHT breast. IMPRESSION: Segmental round calcifications in the RIGHT breast 12 o'clock position middle to posterior depth spanning approximately 4.7 cm. Stereotactic biopsy x1 is recommended. RECOMMENDATION: Stereotactic biopsy of the RIGHT breast x1. I have discussed the findings and recommendations with the patient. The recommended procedure was discussed with the patient and questions were answered. Patient expressed their understanding of the recommendation. Patient will be scheduled for the procedure at her earliest convenience by the schedulers. Ordering provider will be notified. If applicable, a reminder letter will be sent to the patient regarding the next appointment. BI-RADS CATEGORY  4: Suspicious. Electronically Signed   By: Norleen Croak M.D.   On: 06/28/2024 11:38   MM 3D SCREENING MAMMOGRAM BILATERAL BREAST Result Date: 06/15/2024 CLINICAL DATA:  Screening. EXAM: DIGITAL SCREENING BILATERAL MAMMOGRAM WITH TOMOSYNTHESIS AND CAD TECHNIQUE: Bilateral screening digital craniocaudal and mediolateral oblique mammograms were obtained. Bilateral screening digital breast tomosynthesis was performed. The images were evaluated with computer-aided detection. COMPARISON:  Previous exam(s). ACR Breast Density Category c: The breasts are heterogeneously dense, which may obscure small masses. FINDINGS: In the right breast, calcifications about the upper central breast, posterior third, warrant further evaluation with magnified views. In the left breast, no findings suspicious for malignancy. IMPRESSION: Further evaluation is suggested for calcifications in the right breast. RECOMMENDATION: Diagnostic mammogram of the right breast. (Code:[id number]) The patient will be  contacted regarding the findings, and additional imaging will be scheduled.  BI-RADS CATEGORY  0: Incomplete: Need additional imaging evaluation. Electronically Signed   By: Curtistine Noble   On: 06/15/2024 13:22    Labs:  CBC: Recent Labs    04/24/24 1610 05/04/24 1515 07/01/24 0859  WBC 3.8* 4.8 5.2  HGB 9.9* 9.5* 9.7*  HCT 31.8* 29.8* 30.7*  PLT 104* 133* 116*    COAGS: No results for input(s): INR, APTT in the last 8760 hours.  BMP: Recent Labs    04/24/24 1610  NA 142  K 3.6  CL 104  CO2 25  GLUCOSE 105*  BUN 18  CALCIUM  9.6  CREATININE 0.99  GFRNONAA >60    LIVER FUNCTION TESTS: Recent Labs    04/24/24 1610  BILITOT 1.3*  AST 50*  ALT 32  ALKPHOS 275*  PROT 8.4*  ALBUMIN 3.8    TUMOR MARKERS: No results for input(s): AFPTM, CEA, CA199, CHROMGRNA in the last 8760 hours.  Assessment and Plan:  Request for  image guided bone marrow biopsy and aspiration approved for 07/01/2024 by Dr. Karalee No contraindications for procedure identified in ROS, physical exam, or review of pre-sedation considerations. CBC with differential to be obtained day of procedure, ordered VSS, afebrile Patient not asked to hold any AC/AP for this low bleeding risk procedure     Risks and benefits of image guided bone marrow biopsy and aspiration was discussed with the patient and/or patient's family including, but not limited to bleeding, infection, damage to adjacent structures or low yield requiring additional tests.  All of the questions were answered and there is agreement to proceed.  Consent signed and in chart.   Thank you for allowing our service to participate in DAMIYAH DITMARS 's care.    Electronically Signed: Laymon Coast, NP   07/01/2024, 9:18 AM     I spent a total of  15 Minutes   in face to face in clinical consultation, greater than 50% of which was counseling/coordinating care for image guided bone marrow biopsy and  aspiration   (A copy of this note was sent to the referring provider and the time of visit.)  "

## 2024-06-28 NOTE — Telephone Encounter (Signed)
 Patient for IR Bone Marrow Biopsy on Friday 07/01/24, I called and spoke with the patient on the phone and gave pre-procedure instructions. Pt was made aware to be here at 8:30a, NPO after MN prior to procedure as well as driver post procedure/recovery/discharge. Pt stated understanding.  Called 06/28/24

## 2024-07-01 ENCOUNTER — Other Ambulatory Visit: Payer: Self-pay

## 2024-07-01 ENCOUNTER — Ambulatory Visit
Admission: RE | Admit: 2024-07-01 | Discharge: 2024-07-01 | Disposition: A | Discharge status: Home / Self Care | Source: Ambulatory Visit | Attending: Oncology | Admitting: Oncology

## 2024-07-01 ENCOUNTER — Encounter: Payer: Self-pay | Admitting: Radiology

## 2024-07-01 DIAGNOSIS — K7460 Unspecified cirrhosis of liver: Secondary | ICD-10-CM | POA: Diagnosis not present

## 2024-07-01 DIAGNOSIS — Z01818 Encounter for other preprocedural examination: Secondary | ICD-10-CM

## 2024-07-01 DIAGNOSIS — Z1379 Encounter for other screening for genetic and chromosomal anomalies: Secondary | ICD-10-CM | POA: Insufficient documentation

## 2024-07-01 DIAGNOSIS — E785 Hyperlipidemia, unspecified: Secondary | ICD-10-CM | POA: Diagnosis not present

## 2024-07-01 DIAGNOSIS — D72819 Decreased white blood cell count, unspecified: Secondary | ICD-10-CM | POA: Diagnosis not present

## 2024-07-01 DIAGNOSIS — I10 Essential (primary) hypertension: Secondary | ICD-10-CM | POA: Insufficient documentation

## 2024-07-01 DIAGNOSIS — Z79899 Other long term (current) drug therapy: Secondary | ICD-10-CM | POA: Insufficient documentation

## 2024-07-01 DIAGNOSIS — D509 Iron deficiency anemia, unspecified: Secondary | ICD-10-CM | POA: Diagnosis present

## 2024-07-01 DIAGNOSIS — L930 Discoid lupus erythematosus: Secondary | ICD-10-CM | POA: Diagnosis not present

## 2024-07-01 HISTORY — PX: IR BONE MARROW BIOPSY & ASPIRATION: IMG5727

## 2024-07-01 LAB — CBC WITH DIFFERENTIAL/PLATELET
Abs Immature Granulocytes: 0.01 K/uL (ref 0.00–0.07)
Basophils Absolute: 0 K/uL (ref 0.0–0.1)
Basophils Relative: 0 %
Eosinophils Absolute: 0.1 K/uL (ref 0.0–0.5)
Eosinophils Relative: 3 %
HCT: 30.7 % — ABNORMAL LOW (ref 36.0–46.0)
Hemoglobin: 9.7 g/dL — ABNORMAL LOW (ref 12.0–15.0)
Immature Granulocytes: 0 %
Lymphocytes Relative: 49 %
Lymphs Abs: 2.6 K/uL (ref 0.7–4.0)
MCH: 22.2 pg — ABNORMAL LOW (ref 26.0–34.0)
MCHC: 31.6 g/dL (ref 30.0–36.0)
MCV: 70.4 fL — ABNORMAL LOW (ref 80.0–100.0)
Monocytes Absolute: 0.6 K/uL (ref 0.1–1.0)
Monocytes Relative: 12 %
Neutro Abs: 1.8 K/uL (ref 1.7–7.7)
Neutrophils Relative %: 36 %
Platelets: 116 K/uL — ABNORMAL LOW (ref 150–400)
RBC: 4.36 MIL/uL (ref 3.87–5.11)
RDW: 16 % — ABNORMAL HIGH (ref 11.5–15.5)
WBC: 5.2 K/uL (ref 4.0–10.5)
nRBC: 0 % (ref 0.0–0.2)

## 2024-07-01 LAB — PROTIME-INR
INR: 1 (ref 0.8–1.2)
Prothrombin Time: 13.5 s (ref 11.4–15.2)

## 2024-07-01 MED ORDER — SODIUM CHLORIDE 0.9 % IV SOLN: INTRAVENOUS | Status: DC

## 2024-07-01 MED ORDER — FENTANYL CITRATE (PF) 100 MCG/2ML IJ SOLN
INTRAMUSCULAR | Status: AC
  Filled 2024-07-01: qty 2

## 2024-07-01 MED ORDER — MIDAZOLAM HCL (PF) 2 MG/2ML IJ SOLN
INTRAMUSCULAR | Status: AC | PRN
  Administered 2024-07-01: .5 mg via INTRAVENOUS
  Administered 2024-07-01: 1 mg via INTRAVENOUS
  Administered 2024-07-01: .5 mg via INTRAVENOUS

## 2024-07-01 MED ORDER — FENTANYL CITRATE (PF) 100 MCG/2ML IJ SOLN
INTRAMUSCULAR | Status: AC | PRN
  Administered 2024-07-01 (×2): 25 ug via INTRAVENOUS
  Administered 2024-07-01: 50 ug via INTRAVENOUS

## 2024-07-01 MED ORDER — HEPARIN SOD (PORK) LOCK FLUSH 100 UNIT/ML IV SOLN
INTRAVENOUS | Status: AC
  Filled 2024-07-01: qty 5

## 2024-07-01 MED ORDER — LIDOCAINE 1 % OPTIME INJ - NO CHARGE
5.0000 mL | Freq: Once | INTRAMUSCULAR | Status: AC
  Administered 2024-07-01: 5 mL via INTRADERMAL
  Filled 2024-07-01: qty 6

## 2024-07-01 MED ORDER — MIDAZOLAM HCL 2 MG/2ML IJ SOLN
INTRAMUSCULAR | Status: AC
  Filled 2024-07-01: qty 2

## 2024-07-01 NOTE — Discharge Instructions (Signed)
 Bone Marrow Aspiration and Bone Marrow Biopsy, Adult, Care After This sheet gives you information about how to care for yourself after your procedure. If you have problems or questions, contact your health care provider.  What can I expect after the procedure?  After the procedure, it is common to have: Mild pain and tenderness. Swelling. Bruising.  Follow these instructions at home: Take over-the-counter or prescription medicines only as told by your health care provider. You may shower tomorrow Remove band aid tomorrow, replace with another bandaid if  site has any drainage from biopsy site. Wash your hands with soap and water before you touch your biopsy site  If soap and water are not available, use hand sanitizer. Change your dressing frequently for bleeding and/or drainage. Check your puncture site every day for signs of infection. Check for: More redness, swelling, or pain. More fluid or blood. Warmth. Pus or a bad smell. Return to your normal activities in 24hours.  Do not drive for 24 hours if you were given a medicine to help you relax (sedative). Keep all follow-up visits as told by your health care provider. This is important. Contact a health care provider if: You have more redness, swelling, or pain around the puncture site. You have more fluid or blood coming from the puncture site. Your puncture site feels warm to the touch. You have pus or a bad smell coming from the puncture site. You have a fever. Your pain is not controlled with medicine. This information is not intended to replace advice given to you by your health care provider. Make sure you discuss any questions you have with your health care provider. Document Released: 01/03/2005 Document Revised: 01/04/2016 Document Reviewed: 11/28/2015 Elsevier Interactive Patient Education  2018 [employer].

## 2024-07-01 NOTE — Procedures (Signed)
Interventional Radiology Procedure Note  Procedure: CT guided aspirate and core biopsy of right iliac bone Complications: None Recommendations: - Bedrest supine x 1 hrs - Hydrocodone PRN  Pain - Follow biopsy results  Signed,  Heath K. McCullough, MD   

## 2024-07-05 ENCOUNTER — Other Ambulatory Visit: Payer: Self-pay | Admitting: General Practice

## 2024-07-05 DIAGNOSIS — R928 Other abnormal and inconclusive findings on diagnostic imaging of breast: Secondary | ICD-10-CM

## 2024-07-06 ENCOUNTER — Encounter

## 2024-07-06 ENCOUNTER — Ambulatory Visit
Admission: RE | Admit: 2024-07-06 | Discharge: 2024-07-06 | Disposition: A | Discharge status: Home / Self Care | Source: Ambulatory Visit | Attending: General Practice | Admitting: General Practice

## 2024-07-06 ENCOUNTER — Inpatient Hospital Stay: Admission: RE | Admit: 2024-07-06

## 2024-07-06 DIAGNOSIS — R928 Other abnormal and inconclusive findings on diagnostic imaging of breast: Secondary | ICD-10-CM

## 2024-07-06 DIAGNOSIS — N6021 Fibroadenosis of right breast: Secondary | ICD-10-CM | POA: Insufficient documentation

## 2024-07-06 HISTORY — PX: BREAST BIOPSY: SHX20

## 2024-07-06 MED ORDER — LIDOCAINE 1 % OPTIME INJ - NO CHARGE
5.0000 mL | Freq: Once | INTRAMUSCULAR | Status: AC
  Administered 2024-07-06: 5 mL
  Filled 2024-07-06: qty 6

## 2024-07-06 MED ORDER — LIDOCAINE-EPINEPHRINE 1 %-1:100000 IJ SOLN
20.0000 mL | Freq: Once | INTRAMUSCULAR | Status: AC
  Administered 2024-07-06: 20 mL

## 2024-07-07 ENCOUNTER — Encounter (HOSPITAL_COMMUNITY): Payer: Self-pay

## 2024-07-07 LAB — SURGICAL PATHOLOGY

## 2024-07-08 LAB — SURGICAL PATHOLOGY

## 2024-07-08 LAB — ALPHA-THALASSEMIA ANALYSIS: IMAGE: 0

## 2024-07-13 ENCOUNTER — Encounter: Payer: Self-pay | Admitting: General Practice

## 2024-07-13 ENCOUNTER — Ambulatory Visit (INDEPENDENT_AMBULATORY_CARE_PROVIDER_SITE_OTHER): Admitting: General Practice

## 2024-07-13 ENCOUNTER — Telehealth: Payer: Self-pay | Admitting: General Practice

## 2024-07-13 VITALS — BP 128/60 | HR 80 | Temp 97.6°F | Ht 61.0 in | Wt 127.0 lb

## 2024-07-13 DIAGNOSIS — D61818 Other pancytopenia: Secondary | ICD-10-CM | POA: Insufficient documentation

## 2024-07-13 DIAGNOSIS — Z0000 Encounter for general adult medical examination without abnormal findings: Secondary | ICD-10-CM | POA: Insufficient documentation

## 2024-07-13 DIAGNOSIS — M329 Systemic lupus erythematosus, unspecified: Secondary | ICD-10-CM | POA: Diagnosis not present

## 2024-07-13 DIAGNOSIS — K766 Portal hypertension: Secondary | ICD-10-CM | POA: Diagnosis not present

## 2024-07-13 NOTE — Telephone Encounter (Signed)
 Patient is stating she was seen in October for New Patient and it was billed to insurance as routine annual exam. And they will not pay for 2 in same year.  States was advised by billing to speak to someone in our office regarding the charges.

## 2024-07-13 NOTE — Assessment & Plan Note (Signed)
 No concerns today.

## 2024-07-13 NOTE — Assessment & Plan Note (Signed)
 Immunizations tdap UTD; declines other vaccines.  Mammogram UTD Colonoscopy UTD,   Discussed the importance of a healthy diet and regular exercise in order for weight loss, and to reduce the risk of further co-morbidity.  Exam stable. Labs pending.  Follow up in 1 year for repeat physical.

## 2024-07-13 NOTE — Assessment & Plan Note (Signed)
 Following with hematology and oncology.

## 2024-07-13 NOTE — Assessment & Plan Note (Signed)
 Controlled. No concerns today.  Following with cardiology.

## 2024-07-13 NOTE — Patient Instructions (Signed)
 Stop by the lab prior to leaving today. I will notify you of your results once received.   Follow up in 6 months.   It was a pleasure to see you today!

## 2024-07-13 NOTE — Progress Notes (Signed)
 "  Established Patient Office Visit  Subjective   Patient ID: Valerie Rojas, female    DOB: [birth year]  Age: 69 y.o. MRN: [id number]  Chief Complaint  Patient presents with   Annual Exam    Patient not fasting right now but can come back later today to have done.     HPI  Valerie Rojas is a 69 year old female with past medical history of portal hypertension, TIA, hepatic cirrhosis, GERD, primary bilary cholangitis, microcytic anemia, luis presents today for complete physical and follow up of chronic conditions.  Immunizations: -Tetanus: Completed in 2023 -Influenza: due -Shingles: due -Pneumonia: due   Diet: Fair diet.  Exercise: No regular exercise.  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% (04/24/2024) 04/24/2024   Monoclonal B-cell lymphocytosis of undetermined significance 04/24/2024   Hypertensive urgency 04/24/2024   Postural dizziness with presyncope 04/24/2024   Discoid lupus    Primary biliary cholangitis (HCC) 04/12/2024   Portal hypertension (HCC) 04/12/2024   Hepatic 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 (HCC) 04/12/2024   Lupus (systemic lupus erythematosus) (HCC) 06/17/2021   Elevated liver enzymes 04/03/2013   GERD 09/02/2007   Past Medical History:  Diagnosis Date   Anemia    pt reports a while back   Discoid lupus    Hyperlipidemia    high level on meds to prevent getting worse    Hypertension    Liver disease    Other specified disorders of liver    In process of dx issues with liver/bile duct   Past Surgical History:  Procedure Laterality Date   BREAST BIOPSY Right 07/06/2024   MM RT BREAST BX W LOC DEV 1ST LESION IMAGE BX SPEC STEREO GUIDE 07/06/2024 [facility]   COLONOSCOPY  2011[facility]   IR BONE MARROW BIOPSY & ASPIRATION  07/01/2024   LIVER BIOPSY     Allergies[1]       07/13/2024   10:34 AM 06/03/2024   11:43 AM 05/04/2024    2:19 PM  Depression screen PHQ 2/9  Decreased Interest 0 0 0  Down, Depressed, Hopeless 0 0 0  PHQ - 2 Score 0 0 0  Altered sleeping 0 0   Tired, decreased energy 0 0   Change in appetite 0 0   Feeling bad or failure about yourself  0 0   Trouble concentrating 0 0   Moving slowly or fidgety/restless 0 0   Suicidal thoughts 0 0   PHQ-9 Score 0 0   Difficult doing work/chores Not difficult at all Not difficult at all        07/13/2024   10:34 AM 04/28/2024   10:38 AM  GAD 7 : Generalized Anxiety Score  Nervous, Anxious, on Edge 0 0  Control/stop worrying 0 0  Worry too much - different things 0 1  Trouble relaxing 0 0  Restless 0 0  Easily annoyed or irritable 0 0  Afraid - awful might happen 0 0  Total  GAD 7 Score 0 1  Anxiety Difficulty Not difficult at all Not difficult at all      Review of Systems  Constitutional:  Negative for chills, fever, malaise/fatigue and weight loss.  HENT:  Negative for congestion, ear discharge, ear pain, hearing loss, nosebleeds, sinus pain, sore throat and tinnitus.   Eyes:  Negative for blurred vision, double vision, pain, discharge and redness.  Respiratory:  Negative for cough, shortness of breath, wheezing and stridor.   Cardiovascular:  Negative for chest pain, palpitations and leg swelling.  Gastrointestinal:  Negative for abdominal pain, constipation, diarrhea, heartburn, nausea and vomiting.  Genitourinary:  Negative for dysuria, frequency and urgency.  Musculoskeletal:   Negative for myalgias.  Skin:  Negative for rash.  Neurological:  Negative for dizziness, tingling, seizures, weakness and headaches.  Psychiatric/Behavioral:  Negative for depression, substance abuse and suicidal ideas. The patient is not nervous/anxious.       Objective:     BP 128/60   Pulse 80   Temp 97.6 F (36.4 C) (Temporal)   Ht 5' 1 (1.549 m)   Wt 127 lb (57.6 kg)   SpO2 98%   BMI 24.00 kg/m  BP Readings from Last 3 Encounters:  07/13/24 128/60  07/01/24 (!) 162/65  06/21/24 130/66   Wt Readings from Last 3 Encounters:  07/13/24 127 lb (57.6 kg)  07/01/24 129 lb (58.5 kg)  06/21/24 126 lb (57.2 kg)      Physical Exam Vitals and nursing note reviewed.  Constitutional:      Appearance: Normal appearance.  HENT:     Head: Normocephalic and atraumatic.     Right Ear: Tympanic membrane, ear canal and external ear normal.     Left Ear: Tympanic membrane, ear canal and external ear normal.     Nose: Nose normal.     Mouth/Throat:     Mouth: Mucous membranes are moist.     Pharynx: Oropharynx is clear.  Eyes:     Conjunctiva/sclera: Conjunctivae normal.     Pupils: Pupils are equal, round, and reactive to light.  Cardiovascular:     Rate and Rhythm: Normal rate and regular rhythm.     Pulses: Normal pulses.     Heart sounds: Normal heart sounds.  Pulmonary:     Effort: Pulmonary effort is normal.     Breath sounds: Normal breath sounds.  Abdominal:     General: Abdomen is flat. Bowel sounds are normal.     Palpations: Abdomen is soft.  Musculoskeletal:        General: Normal range of motion.     Cervical back: Normal range of motion.  Skin:    General: Skin is warm and dry.     Capillary Refill: Capillary refill takes less than 2 seconds.  Neurological:     General: No focal deficit present.     Mental Status: She is alert and oriented to person, place, and time. Mental status is at baseline.  Psychiatric:        Mood and Affect: Mood normal.         Behavior: Behavior normal.        Thought Content: Thought content normal.        Judgment: Judgment normal.      No results found for any visits on 07/13/24.     The ASCVD Risk score (Arnett DK, et al., 2019) failed to calculate for the following reasons:   Risk score cannot be calculated because patient has a medical  history suggesting prior/existing ASCVD   * - Cholesterol units were assumed    Assessment & Plan:  Encounter for screening and preventative care Assessment & Plan: Immunizations tdap UTD; declines other vaccines.  Mammogram UTD Colonoscopy UTD,   Discussed the importance of a healthy diet and regular exercise in order for weight loss, and to reduce the risk of further co-morbidity.  Exam stable. Labs pending.  Follow up in 1 year for repeat physical.   Orders: -     Lipid panel; Future -     TSH; Future -     Hepatic function panel; Future  Other pancytopenia [facility]) Assessment & Plan: Following with hematology and oncology.   Portal hypertension (HCC) Assessment & Plan: Controlled. No concerns today.  Following with cardiology.   Systemic lupus erythematosus, unspecified SLE type, unspecified organ involvement status (HCC) Assessment & Plan: No concerns today.      Return in about 6 months (around 01/10/2025) for chronic care management.SABRA Carrol Aurora, NP     [1] No Known Allergies  "

## 2024-07-13 NOTE — Telephone Encounter (Signed)
 Discussed with patient, she states that she forgot that she had one in January with her previous provider. She understands that I can not change the code given that it was does with the medicare wellness nurse.   -Carrol Aurora, DNP, AGNP-C 07/13/2024 10:49 AM

## 2024-07-14 ENCOUNTER — Telehealth: Payer: Self-pay

## 2024-07-14 ENCOUNTER — Inpatient Hospital Stay: Attending: Oncology | Admitting: Oncology

## 2024-07-14 ENCOUNTER — Ambulatory Visit: Payer: Self-pay | Admitting: General Practice

## 2024-07-14 ENCOUNTER — Encounter: Payer: Self-pay | Admitting: Oncology

## 2024-07-14 ENCOUNTER — Other Ambulatory Visit

## 2024-07-14 VITALS — BP 142/62 | HR 71 | Temp 97.8°F | Resp 18 | Ht 61.0 in | Wt 130.0 lb

## 2024-07-14 DIAGNOSIS — D696 Thrombocytopenia, unspecified: Secondary | ICD-10-CM | POA: Diagnosis not present

## 2024-07-14 DIAGNOSIS — D72820 Lymphocytosis (symptomatic): Secondary | ICD-10-CM | POA: Insufficient documentation

## 2024-07-14 DIAGNOSIS — Z13220 Encounter for screening for lipoid disorders: Secondary | ICD-10-CM | POA: Diagnosis not present

## 2024-07-14 DIAGNOSIS — Z800 Family history of malignant neoplasm of digestive organs: Secondary | ICD-10-CM | POA: Insufficient documentation

## 2024-07-14 DIAGNOSIS — Z79899 Other long term (current) drug therapy: Secondary | ICD-10-CM | POA: Insufficient documentation

## 2024-07-14 DIAGNOSIS — D509 Iron deficiency anemia, unspecified: Secondary | ICD-10-CM | POA: Insufficient documentation

## 2024-07-14 DIAGNOSIS — K743 Primary biliary cirrhosis: Secondary | ICD-10-CM | POA: Diagnosis not present

## 2024-07-14 DIAGNOSIS — E785 Hyperlipidemia, unspecified: Secondary | ICD-10-CM | POA: Diagnosis not present

## 2024-07-14 DIAGNOSIS — F32A Depression, unspecified: Secondary | ICD-10-CM | POA: Diagnosis not present

## 2024-07-14 DIAGNOSIS — I10 Essential (primary) hypertension: Secondary | ICD-10-CM | POA: Insufficient documentation

## 2024-07-14 DIAGNOSIS — Z0000 Encounter for general adult medical examination without abnormal findings: Secondary | ICD-10-CM

## 2024-07-14 DIAGNOSIS — D563 Thalassemia minor: Secondary | ICD-10-CM | POA: Insufficient documentation

## 2024-07-14 LAB — HEPATIC FUNCTION PANEL
ALT: 24 U/L (ref 3–35)
AST: 37 U/L (ref 5–37)
Albumin: 4.2 g/dL (ref 3.5–5.2)
Alkaline Phosphatase: 316 U/L — ABNORMAL HIGH (ref 39–117)
Bilirubin, Direct: 0.4 mg/dL — ABNORMAL HIGH (ref 0.1–0.3)
Total Bilirubin: 1 mg/dL (ref 0.2–1.2)
Total Protein: 7.6 g/dL (ref 6.0–8.3)

## 2024-07-14 LAB — LIPID PANEL
Cholesterol: 165 mg/dL (ref 28–200)
HDL: 60.6 mg/dL
LDL Cholesterol: 88 mg/dL (ref 10–99)
NonHDL: 104.85
Total CHOL/HDL Ratio: 3
Triglycerides: 82 mg/dL (ref 10.0–149.0)
VLDL: 16.4 mg/dL (ref 0.0–40.0)

## 2024-07-14 LAB — TSH: TSH: 1.74 u[IU]/mL (ref 0.35–5.50)

## 2024-07-14 NOTE — Assessment & Plan Note (Addendum)
 Discussed with patient that Alpha thalassemia trait is a genetic condition where a person has one or two mutated genes for alpha globin, leading to mild anemia but usually no serious health issues.  No need for intervention.

## 2024-07-14 NOTE — Assessment & Plan Note (Signed)
 Chronic microcytic anemia, multifactorial Patient has adequate iron level, copper  level B12 and folate.  Negative parvovirus Patient has alpha thalassemia trait which will contribute to the chronic microcytotic anemia. Patient has multiple lines of cytopenia.  Hemoglobin progressively decreases.   Bone marrow biopsy did not show Mildly hypercellular bone marrow (30 to 40%) with otherwise orderly trilineage hematopoiesis and numerous lymphoid aggregates. no definitive morphologic evidence of dysplasia or underlying etiology.  She has normal cytogenetics.  Will check myeloid MDS   anemia is likely secondary to chronic inflammation/cirrhosis.  Monitor.

## 2024-07-14 NOTE — Telephone Encounter (Signed)
 spoke to [facility] and [facility]. 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

## 2024-07-14 NOTE — Assessment & Plan Note (Signed)
 Chronic thrombocytopenia, likely secondary to cirrhosis.

## 2024-07-14 NOTE — Assessment & Plan Note (Signed)
Follow up with hepatology.

## 2024-07-14 NOTE — Assessment & Plan Note (Signed)
 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 Rule out other etiologies.  Workup showed adequate B12, folate, negative M protein on protein after pheresis, normal light chain ratio  Repeat blood work showed normal white count with no abnormality in differentials.  Monoclonal lymphocytosis of unknown significance.  Observation

## 2024-07-14 NOTE — Progress Notes (Signed)
 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 Rule out other etiologies.  Workup showed adequate B12, folate, negative M protein on protein after pheresis, normal light chain ratio  Repeat blood work showed normal white count with no abnormality in differentials.  Monoclonal lymphocytosis of unknown significance.  Observation  Hepatic 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 (HCC) Follow up with hepatology  Microcytic anemia Chronic microcytic anemia, multifactorial Patient has adequate iron level, copper  level B12 and folate.  Negative parvovirus Patient has alpha thalassemia trait which will contribute to the chronic microcytotic anemia. Patient has multiple lines of cytopenia.  Hemoglobin progressively decreases.   Bone marrow biopsy did not show Mildly hypercellular bone marrow (30 to 40%) with otherwise orderly trilineage hematopoiesis and numerous lymphoid aggregates. no definitive morphologic evidence of dysplasia or underlying etiology.  She has normal cytogenetics.  Will check myeloid MDS   anemia is likely secondary to chronic inflammation/cirrhosis.  Monitor.  Alpha thalassemia trait Discussed with patient that Alpha thalassemia trait is a genetic condition where a person has one or two mutated genes for alpha globin, leading to mild anemia but usually no serious health issues.  No need for intervention.    Thrombocytopenia Chronic thrombocytopenia, likely secondary to cirrhosis.    Orders Placed This Encounter  Procedures   CBC with Differential (Cancer Center Only)    Standing Status:   Future    Expected Date:   01/11/2025    Expiration Date:   04/11/2025   CMP (Cancer  Center only)    Standing Status:   Future    Expected Date:   01/11/2025    Expiration Date:   04/11/2025   Follow-up 6 months All questions were answered. The patient knows to call the clinic with any problems, questions or concerns.  Zelphia Cap, MD, PhD [facility] Hematology Oncology 07/14/2024   HISTORY OF PRESENTING ILLNESS:   Valerie Rojas is a  69 y.o.  female with PMH listed below was seen in consultation at the request of  Vincente Shivers, NP  for evaluation of pancytopenia  Discussed the use of AI scribe software for clinical note transcription with the patient, who gave verbal consent to proceed.   She has a history of primary biliary cholangitis (PBC) and liver cirrhosis, with associated portal hypertension. She takes Carvedilol twice daily for portal hypertension. Her liver specialist monitors her every six months with full liver screening and imaging. She has been on ursodiol for years, which maintained her enzyme levels, but switched to [place] after experiencing bruising with O[place]iva .  She has chronic pancytopenia.   She currently takes ursodiol 300 mg three times a day and Livdelzy without side effects.  Recently, she experienced a minor transient ischemic attack (TIA) on April 24, 2024, and was treated in the emergency department. 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 She was started on Plavix  and a cholesterol-lowering medication.   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 She reports stable weight and good appetite. No excessive night sweats. She has noticed skin discoloration on her trunk  INTERVAL HISTORY Valerie Rojas is a  69 y.o. female who has above history reviewed by me today presents for follow up visit for anemia, leukopenia, thrombocytopenia.  Patient reports feeling well at baseline today.  Chronic fatigue.  Patient presents discussed  about bone marrow results.  She has no new complaints.  MEDICAL HISTORY:  Past Medical History:  Diagnosis Date   Anemia    pt reports a while back   Discoid lupus    Hyperlipidemia    high level on meds to prevent getting worse   Hypertension    Liver disease    Other specified disorders of liver    In process of dx issues with liver/bile duct    SURGICAL HISTORY: Past Surgical History:  Procedure Laterality Date   BREAST BIOPSY Right 07/06/2024   MM RT BREAST BX W LOC DEV 1ST LESION IMAGE BX SPEC STEREO GUIDE 07/06/2024 [facility]   COLONOSCOPY  2011[facility]   IR BONE MARROW BIOPSY & ASPIRATION  07/01/2024   LIVER BIOPSY      SOCIAL HISTORY: Social History   Socioeconomic History   Marital status: Widowed    Spouse name: Not on file   Number of children: Not on file   Years of education: Not on file   Highest education level: GED or equivalent  Occupational History   Not on file  Tobacco Use   Smoking status: Never   Smokeless tobacco: Never  Vaping Use   Vaping status: Never Used  Substance and Sexual Activity   Alcohol use: No   Drug use: No   Sexual activity: Yes  Other Topics Concern   Not on file  Social History Narrative   widowed   Social Drivers of Health   Tobacco Use: Low Risk (07/14/2024)   Patient History    Smoking Tobacco Use: Never    Smokeless Tobacco Use: Never    Passive Exposure: Not on file  Financial Resource Strain: Low Risk  (06/09/2024)   Received from [facility]   Overall Financial Resource Strain (CARDIA)    Difficulty of Paying Living Expenses: Not hard at all  Food Insecurity: No Food Insecurity (06/09/2024)   Received from [facility]   Epic    Within the past 12 months, you worried that your food would run out before you got the money to buy more.: Never true    Within the past 12 months, the food you bought just didn't last and you didn't have money to get more.: Never true   Transportation Needs: No Transportation Needs (06/09/2024)   Received from [facility] - Transportation    In the past 12 months, has lack of transportation kept you from medical appointments or from getting medications?: No    Lack of Transportation (Non-Medical): No  Physical Activity: Sufficiently Active (06/03/2024)   Exercise Vital Sign    Days of Exercise per Week: 5 days    Minutes of Exercise per Session: 30 min  Stress: No Stress Concern Present (06/03/2024)   [employer] - Occupational Stress Questionnaire    Feeling of Stress: Not at all  Social Connections: Moderately Integrated (06/03/2024)   Social Connection and Isolation Panel    Frequency of Communication with Friends and Family: More than three times a week    Frequency of Social Gatherings with Friends and Family: Once a week    Attends Religious Services: 1 to 4 times per year    Active Member of [employer] or Organizations:  Yes    Attends Club or Organization Meetings: 1 to 4 times per year    Marital Status: Widowed  Intimate Partner Violence: Not At Risk (06/03/2024)   Epic    Fear of Current or Ex-Partner: No    Emotionally Abused: No    Physically Abused: No    Sexually Abused: No  Depression (PHQ2-9): Low Risk (07/13/2024)   Depression (PHQ2-9)    PHQ-2 Score: 0  Alcohol Screen: Not on file  Housing: Unknown (06/09/2024)   Received from [facility]   Epic    In the last 12 months, was there a time when you were not able to pay the mortgage or rent on time?: No    Number of Times Moved in the Last Year: Not on file    At any time in the past 12 months, were you homeless or living in a shelter (including now)?: No  Recent Concern: Housing - High Risk (05/04/2024)   Epic    Unable to Pay for Housing in the Last Year: Yes    Number of Times Moved in the Last Year: 0    Homeless in the Last Year: No  Utilities: Not At Risk (06/09/2024)    Received from [facility]   Epic    In the past 12 months has the electric, gas, oil, or water company threatened to shut off services in your home?: No  Health Literacy: Adequate Health Literacy (06/03/2024)   B1300 Health Literacy    Frequency of need for help with medical instructions: Never    FAMILY HISTORY: Family History  Problem Relation Age of Onset   Heart disease Mother    Stroke Mother    Diabetes Father    Diabetes Brother    Colon cancer Paternal Aunt        50's at dx   Heart disease Maternal Grandmother    Diabetes Paternal Grandmother    Esophageal cancer Neg Hx    Rectal cancer Neg Hx    Stomach cancer Neg Hx    Colon polyps Neg Hx    Pancreatic cancer Neg Hx     ALLERGIES:  has no known allergies.  MEDICATIONS:  Current Outpatient Medications  Medication Sig Dispense Refill   atorvastatin  (LIPITOR) 40 MG tablet Take 1 tablet (40 mg total) by mouth daily. 30 tablet 2   carvedilol (COREG) 3.125 MG tablet Take 3.125 mg by mouth 2 (two) times daily with a meal.     cholecalciferol (VITAMIN D3) 25 MCG (1000 UNIT) tablet Take 1,000 Units by mouth daily.     LIVDELZI 10 MG CAPS Take 1 capsule by mouth daily.     ursodiol (ACTIGALL) 250 MG tablet Take 250 mg by mouth 2 (two) times daily. 2 tablets in the morning and 1 tablet in the evening     vitamin B-12 (CYANOCOBALAMIN ) 100 MCG tablet Take 100 mcg by mouth daily.     Current Facility-Administered Medications  Medication Dose Route Frequency Provider Last Rate Last Admin   0.9 %  sodium chloride  infusion  500 mL Intravenous Once Beavers, Kimberly, MD        Review of Systems  Constitutional:  Negative for appetite change, chills, fatigue and fever.  HENT:   Negative for hearing loss and voice change.   Eyes:  Negative for eye problems.  Respiratory:  Negative for chest tightness and cough.   Cardiovascular:  Negative for chest pain.  Gastrointestinal:  Negative for abdominal distention,  abdominal pain and blood in stool.  Endocrine: Negative for hot flashes.  Genitourinary:  Negative for difficulty urinating and frequency.   Musculoskeletal:  Negative for arthralgias.  Skin:  Negative for itching and rash.  Neurological:  Negative for extremity weakness.  Hematological:  Negative for adenopathy.  Psychiatric/Behavioral:  Negative for confusion.    PHYSICAL EXAMINATION:  Vitals:   07/14/24 1454  BP: (!) 142/62  Pulse: 71  Resp: 18  Temp: 97.8 F (36.6 C)  SpO2: 97%   Filed Weights   07/14/24 1454  Weight: 130 lb (59 kg)    Physical Exam Constitutional:      General: She is not in acute distress. HENT:     Head: Normocephalic and atraumatic.  Eyes:     General: No scleral icterus. Cardiovascular:     Rate and Rhythm: Normal rate.  Pulmonary:     Effort: Pulmonary effort is normal. No respiratory distress.  Musculoskeletal:        General: Normal range of motion.     Cervical back: Normal range of motion.  Skin:    Findings: No erythema or rash.     Comments: Areas of hyperpigmentation on her trunk  Neurological:     Mental Status: She is alert and oriented to person, place, and time. Mental status is at baseline.  Psychiatric:        Mood and Affect: Mood normal.     LABORATORY DATA:  I have reviewed the data as listed    Latest Ref Rng & Units 07/01/2024    8:59 AM 05/04/2024    3:15 PM 04/24/2024    4:10 PM  CBC  WBC 4.0 - 10.5 K/uL 5.2  4.8  3.8   Hemoglobin 12.0 - 15.0 g/dL 9.7  9.5  9.9   Hematocrit 36.0 - 46.0 % 30.7  29.8  31.8   Platelets 150 - 400 K/uL 116  133  104       Latest Ref Rng & Units 07/14/2024    9:20 AM 04/24/2024    4:10 PM 04/28/2022    2:56 PM  CMP  Glucose 70 - 99 mg/dL  894  95   BUN 8 - 23 mg/dL  18  15   Creatinine 9.55 - 1.00 mg/dL  9.00  9.35   Sodium 864 - 145 mmol/L  142  138   Potassium 3.5 - 5.1 mmol/L  3.6  4.0   Chloride 98 - 111 mmol/L  104  103   CO2 22 - 32 mmol/L  25  26   Calcium  8.9 -  10.3 mg/dL  9.6  9.7   Total Protein 6.0 - 8.3 g/dL 7.6  8.4  8.0   Total Bilirubin 0.2 - 1.2 mg/dL 1.0  1.3  0.6   Alkaline Phos 39 - 117 U/L 316  275  379   AST 5 - 37 U/L 37  50  48   ALT 3 - 35 U/L 24  32  45       RADIOGRAPHIC STUDIES: I have personally reviewed the radiological images as listed and agreed with the findings in the report. MM BREAST SURGICAL SPECIMEN NO CHARGE Addendum Date: 07/07/2024 ADDENDUM REPORT: 07/07/2024 12:18 ADDENDUM: PATHOLOGY revealed: Breast, right, needle core biopsy, upper posterior depth, x clip- BENIGN BREAST TISSUE WITH SCLEROSING ADENOSIS AND ASSOCIATED CALCIFICATIONS. Pathology results are CONCORDANT with imaging findings, per Dr. Corean Salter. Pathology results and recommendations below were discussed with patient by telephone. Patient reported biopsy site doing  well with no adverse symptoms, and only slight tenderness at the site. Post biopsy care instructions were reviewed, questions were answered and my direct phone number was provided. Patient was instructed to call [facility] for any additional questions or concerns related to biopsy site. RECOMMENDATION: Patient instructed to resume annual bilateral screening mammogram due December 2026. Pathology results reported by Mliss CHARM Molt RN 07/07/2024. Electronically Signed   By: Corean Salter M.D.   On: 07/07/2024 12:18   Result Date: 07/07/2024 CLINICAL DATA:  Indeterminate RIGHT breast calcifications EXAM: RIGHT BREAST STEREOTACTIC CORE NEEDLE BIOPSY COMPARISON:  Previous exam(s). FINDINGS: The patient and I discussed the procedure of stereotactic-guided biopsy including benefits and alternatives. We discussed the high likelihood of a successful procedure. We discussed the risks of the procedure including infection, bleeding, tissue injury, clip migration, and inadequate sampling. Informed written consent was given. The usual time out protocol was performed immediately prior to the  procedure. Using sterile technique and 1% lidocaine  and 1% lidocaine  with epinephrine  as local anesthetic, under stereotactic guidance, a 9 gauge vacuum assisted device was used to perform core needle biopsy of calcifications in the upper inner quadrant of the RIGHT breast using a MLO approach. Specimen radiograph was performed showing representative calcifications. Specimens with calcifications are identified for pathology. Lesion quadrant: Upper inner quadrant At the conclusion of the procedure, an X shaped tissue marker clip was deployed into the biopsy cavity. Follow-up 2-view mammogram was performed and dictated separately. IMPRESSION: Stereotactic-guided biopsy of indeterminate calcifications. No apparent complications. Electronically Signed: By: Corean Salter M.D. On: 07/06/2024 09:39   MM RT BREAST BX W LOC DEV 1ST LESION IMAGE BX SPEC STEREO GUIDE Addendum Date: 07/07/2024 ADDENDUM REPORT: 07/07/2024 12:18 ADDENDUM: PATHOLOGY revealed: Breast, right, needle core biopsy, upper posterior depth, x clip- BENIGN BREAST TISSUE WITH SCLEROSING ADENOSIS AND ASSOCIATED CALCIFICATIONS. Pathology results are CONCORDANT with imaging findings, per Dr. Corean Salter. Pathology results and recommendations below were discussed with patient by telephone. Patient reported biopsy site doing well with no adverse symptoms, and only slight tenderness at the site. Post biopsy care instructions were reviewed, questions were answered and my direct phone number was provided. Patient was instructed to call [facility] for any additional questions or concerns related to biopsy site. RECOMMENDATION: Patient instructed to resume annual bilateral screening mammogram due December 2026. Pathology results reported by Mliss CHARM Molt RN 07/07/2024. Electronically Signed   By: Corean Salter M.D.   On: 07/07/2024 12:18   Result Date: 07/07/2024 CLINICAL DATA:  Indeterminate RIGHT breast calcifications EXAM: RIGHT  BREAST STEREOTACTIC CORE NEEDLE BIOPSY COMPARISON:  Previous exam(s). FINDINGS: The patient and I discussed the procedure of stereotactic-guided biopsy including benefits and alternatives. We discussed the high likelihood of a successful procedure. We discussed the risks of the procedure including infection, bleeding, tissue injury, clip migration, and inadequate sampling. Informed written consent was given. The usual time out protocol was performed immediately prior to the procedure. Using sterile technique and 1% lidocaine  and 1% lidocaine  with epinephrine  as local anesthetic, under stereotactic guidance, a 9 gauge vacuum assisted device was used to perform core needle biopsy of calcifications in the upper inner quadrant of the RIGHT breast using a MLO approach. Specimen radiograph was performed showing representative calcifications. Specimens with calcifications are identified for pathology. Lesion quadrant: Upper inner quadrant At the conclusion of the procedure, an X shaped tissue marker clip was deployed into the biopsy cavity. Follow-up 2-view mammogram was performed and dictated separately. IMPRESSION: Stereotactic-guided biopsy  of indeterminate calcifications. No apparent complications. Electronically Signed: By: Corean Salter M.D. On: 07/06/2024 09:39   MM CLIP PLACEMENT RIGHT Result Date: 07/06/2024 CLINICAL DATA:  Status post stereotactic guided biopsy EXAM: 3D DIAGNOSTIC RIGHT MAMMOGRAM POST STEREOTACTIC BIOPSY COMPARISON:  Previous exam(s). ACR Breast Density Category c: The breasts are heterogeneously dense, which may obscure small masses. FINDINGS: 3D Mammographic images were obtained following stereotactic guided biopsy of calcifications. The X shaped biopsy marking clip is in expected position at the site of biopsy. IMPRESSION: Appropriate positioning of the X shaped biopsy marking clip at the site of biopsy in the upper breast. Final Assessment: Post Procedure Mammograms for Marker  Placement Electronically Signed   By: Corean Salter M.D.   On: 07/06/2024 09:38   IR BONE MARROW BIOPSY & ASPIRATION Result Date: 07/01/2024 INDICATION: 69 year old female with pancytopenia. EXAM: FLUOROSCOPY GUIDED BONE MARROW ASPIRATION AND CORE BIOPSY Interventional Radiologist:  Wilkie LOIS Lent, MD MEDICATIONS: None. ANESTHESIA/SEDATION: Moderate (conscious) sedation was employed during this procedure. A total of 2 milligrams versed  and 100 micrograms fentanyl  were administered intravenously. The patient's level of consciousness and vital signs were monitored continuously by radiology nursing throughout the procedure under my direct supervision. Total monitored sedation time: 10 minutes FLUOROSCOPY: Radiation exposure index: 4 mGy, air kerma COMPLICATIONS: None immediate. Estimated blood loss: <25 mL PROCEDURE: Informed written consent was obtained from the patient after a thorough discussion of the procedural risks, benefits and alternatives. All questions were addressed. Maximal Sterile Barrier Technique was utilized including caps, mask, sterile gowns, sterile gloves, sterile drape, hand hygiene and skin antiseptic. A timeout was performed prior to the initiation of the procedure. The patient was positioned prone and fluoroscopy was performed of the pelvis to demonstrate the iliac marrow spaces. Maximal barrier sterile technique utilized including caps, mask, sterile gowns, sterile gloves, large sterile drape, hand hygiene, and betadine prep. Under sterile conditions and local anesthesia, an 11 gauge coaxial bone biopsy needle was advanced into the right iliac marrow space. Needle position was confirmed with imaging. Initially, bone marrow aspiration was performed. Next, the 11 gauge outer cannula was utilized to obtain a right iliac bone marrow core biopsy. Needle was removed. Hemostasis was obtained with compression. The patient tolerated the procedure well. IMPRESSION: Successful image guided  bone marrow aspiration and core biopsy of the right iliac bone. Electronically Signed   By: Wilkie Lent M.D.   On: 07/01/2024 10:10   MM 3D DIAGNOSTIC MAMMOGRAM UNILATERAL RIGHT BREAST Result Date: 06/28/2024 CLINICAL DATA:  Screening recall RIGHT breast calcifications EXAM: DIGITAL DIAGNOSTIC UNILATERAL RIGHT MAMMOGRAM WITH TOMOSYNTHESIS AND CAD TECHNIQUE: Right digital diagnostic mammography and breast tomosynthesis was performed. The images were evaluated with computer-aided detection. COMPARISON:  Previous exam(s). ACR Breast Density Category c: The breasts are heterogeneously dense, which may obscure small masses. FINDINGS: Full field lateral and spot magnification view(s) were performed and demonstrate segmental round, bordering on coarse calcifications spanning approximately 4.7 cm at the 12 o'clock position middle to posterior depth in the RIGHT breast. IMPRESSION: Segmental round calcifications in the RIGHT breast 12 o'clock position middle to posterior depth spanning approximately 4.7 cm. Stereotactic biopsy x1 is recommended. RECOMMENDATION: Stereotactic biopsy of the RIGHT breast x1. I have discussed the findings and recommendations with the patient. The recommended procedure was discussed with the patient and questions were answered. Patient expressed their understanding of the recommendation. Patient will be scheduled for the procedure at her earliest convenience by the schedulers. Ordering provider will be notified. If applicable, a reminder  letter will be sent to the patient regarding the next appointment. BI-RADS CATEGORY  4: Suspicious. Electronically Signed   By: Norleen Croak M.D.   On: 06/28/2024 11:38   MM 3D SCREENING MAMMOGRAM BILATERAL BREAST Result Date: 06/15/2024 CLINICAL DATA:  Screening. EXAM: DIGITAL SCREENING BILATERAL MAMMOGRAM WITH TOMOSYNTHESIS AND CAD TECHNIQUE: Bilateral screening digital craniocaudal and mediolateral oblique mammograms were obtained. Bilateral  screening digital breast tomosynthesis was performed. The images were evaluated with computer-aided detection. COMPARISON:  Previous exam(s). ACR Breast Density Category c: The breasts are heterogeneously dense, which may obscure small masses. FINDINGS: In the right breast, calcifications about the upper central breast, posterior third, warrant further evaluation with magnified views. In the left breast, no findings suspicious for malignancy. IMPRESSION: Further evaluation is suggested for calcifications in the right breast. RECOMMENDATION: Diagnostic mammogram of the right breast. (Code:[id number]) The patient will be contacted regarding the findings, and additional imaging will be scheduled. BI-RADS CATEGORY  0: Incomplete: Need additional imaging evaluation. Electronically Signed   By: Curtistine Noble   On: 06/15/2024 13:22   ECHOCARDIOGRAM COMPLETE Result Date: 04/25/2024    ECHOCARDIOGRAM REPORT   Patient Name:   EMMALENE KATTNER Date of Exam: 04/25/2024 Medical Rec #:  [id number]      Height:       62.0 in Accession #:    [id number]     Weight:       129.4 lb Date of Birth:  [birth year]     BSA:          1.589 m Patient Age:    67 years       BP:           144/63 mmHg Patient Gender: F              HR:           65 bpm. Exam Location:  [facility] Procedure: 2D Echo, Cardiac Doppler and Color Doppler (Both Spectral and Color            Flow Doppler were utilized during procedure). Indications:     Stroke I63.9  History:         Patient has no prior history of Echocardiogram examinations.                  Stroke.  Sonographer:     Rosina Dunk Referring Phys:  [id number] DELAYNE LULLA SOLIAN Diagnosing Phys: Deatrice Cage MD  Sonographer Comments: Image acquisition challenging due to respiratory motion. IMPRESSIONS  1. Left ventricular ejection fraction, by estimation, is 60 to 65%. The left ventricle has normal function. The left ventricle has no regional wall motion abnormalities. Left ventricular diastolic  parameters were normal.  2. Right ventricular systolic function is normal. The right ventricular size is normal. Tricuspid regurgitation signal is inadequate for assessing PA pressure.  3. The mitral valve is normal in structure. Mild to moderate mitral valve regurgitation. No evidence of mitral stenosis.  4. The aortic valve is normal in structure. Aortic valve regurgitation is not visualized. Aortic valve sclerosis is present, with no evidence of aortic valve stenosis.  5. The inferior vena cava is normal in size with greater than 50% respiratory variability, suggesting right atrial pressure of 3 mmHg. FINDINGS  Left Ventricle: Left ventricular ejection fraction, by estimation, is 60 to 65%. The left ventricle has normal function. The left ventricle has no regional wall motion abnormalities. The left ventricular internal cavity size was normal in size. There is  no left ventricular  hypertrophy. Left ventricular diastolic parameters were normal. Right Ventricle: The right ventricular size is normal. No increase in right ventricular wall thickness. Right ventricular systolic function is normal. Tricuspid regurgitation signal is inadequate for assessing PA pressure. Left Atrium: Left atrial size was normal in size. Right Atrium: Right atrial size was normal in size. Pericardium: There is no evidence of pericardial effusion. Mitral Valve: The mitral valve is normal in structure. Mild to moderate mitral valve regurgitation, with posteriorly-directed jet. No evidence of mitral valve stenosis. MV peak gradient, 4.6 mmHg. The mean mitral valve gradient is 2.0 mmHg. Tricuspid Valve: The tricuspid valve is normal in structure. Tricuspid valve regurgitation is not demonstrated. No evidence of tricuspid stenosis. Aortic Valve: The aortic valve is normal in structure. Aortic valve regurgitation is not visualized. Aortic valve sclerosis is present, with no evidence of aortic valve stenosis. Aortic valve mean gradient measures  3.0 mmHg. Aortic valve peak gradient measures 5.4 mmHg. Aortic valve area, by VTI measures 1.68 cm. Pulmonic Valve: The pulmonic valve was normal in structure. Pulmonic valve regurgitation is not visualized. No evidence of pulmonic stenosis. Aorta: The aortic root is normal in size and structure. Venous: The inferior vena cava is normal in size with greater than 50% respiratory variability, suggesting right atrial pressure of 3 mmHg. IAS/Shunts: No atrial level shunt detected by color flow Doppler.  LEFT VENTRICLE PLAX 2D LVIDd:         4.20 cm     Diastology LVIDs:         2.90 cm     LV e' medial:    8.16 cm/s LV PW:         0.90 cm     LV E/e' medial:  11.4 LV IVS:        0.90 cm     LV e' lateral:   9.03 cm/s LVOT diam:     1.70 cm     LV E/e' lateral: 10.3 LV SV:         48 LV SV Index:   30 LVOT Area:     2.27 cm  LV Volumes (MOD) LV vol d, MOD A2C: 59.3 ml LV vol d, MOD A4C: 67.2 ml LV vol s, MOD A2C: 20.7 ml LV vol s, MOD A4C: 28.3 ml LV SV MOD A2C:     38.6 ml LV SV MOD A4C:     67.2 ml LV SV MOD BP:      39.7 ml RIGHT VENTRICLE             IVC RV Basal diam:  3.00 cm     IVC diam: 1.40 cm RV Mid diam:    2.10 cm RV S prime:     10.70 cm/s TAPSE (M-mode): 2.2 cm LEFT ATRIUM             Index        RIGHT ATRIUM          Index LA diam:        3.50 cm 2.20 cm/m   RA Area:     9.39 cm LA Vol (A2C):   75.5 ml 47.52 ml/m  RA Volume:   15.50 ml 9.76 ml/m LA Vol (A4C):   47.2 ml 29.71 ml/m LA Biplane Vol: 62.4 ml 39.28 ml/m  AORTIC VALVE                    PULMONIC VALVE AV Area (Vmax):    1.79 cm  PV Vmax:        0.90 m/s AV Area (Vmean):   1.68 cm     PV Vmean:       66.900 cm/s AV Area (VTI):     1.68 cm     PV VTI:         0.216 m AV Vmax:           116.00 cm/s  PV Peak grad:   3.3 mmHg AV Vmean:          81.400 cm/s  PV Mean grad:   2.0 mmHg AV VTI:            0.283 m      RVOT Peak grad: 1 mmHg AV Peak Grad:      5.4 mmHg AV Mean Grad:      3.0 mmHg LVOT Vmax:         91.70 cm/s LVOT Vmean:         60.400 cm/s LVOT VTI:          0.210 m LVOT/AV VTI ratio: 0.74  AORTA Ao Root diam: 2.60 cm MITRAL VALVE MV Area (PHT): 3.72 cm       SHUNTS MV Area VTI:   1.58 cm       Systemic VTI:  0.21 m MV Peak grad:  4.6 mmHg       Systemic Diam: 1.70 cm MV Mean grad:  2.0 mmHg       Pulmonic VTI:  0.109 m MV Vmax:       1.07 m/s MV Vmean:      73.7 cm/s MV Decel Time: 204 msec MR Peak grad:    85.4 mmHg MR Vmax:         462.00 cm/s MR PISA:         1.57 cm MR PISA Eff ROA: 13 mm MR PISA Radius:  0.50 cm MV E velocity: 93.40 cm/s MV A velocity: 69.40 cm/s MV E/A ratio:  1.35 Deatrice Cage MD Electronically signed by Deatrice Cage MD Signature Date/Time: 04/25/2024/9:15:40 AM    Final    MR BRAIN WO CONTRAST Result Date: 04/24/2024 EXAM: MRI BRAIN WITHOUT CONTRAST 04/24/2024 07:55:58 PM TECHNIQUE: Multiplanar multisequence MRI of the head/brain was performed without the administration of intravenous contrast. COMPARISON: CTA head and neck same day CLINICAL HISTORY: Intermittent vertigo, elevated BPs. Pt arrives via [facility] from home for a near syncope episode. FINDINGS: BRAIN AND VENTRICLES: No acute infarct. No intracranial hemorrhage. No mass. No midline shift. No hydrocephalus. The sella is unremarkable. Normal flow voids. ORBITS: No acute abnormality. SINUSES AND MASTOIDS: No acute abnormality. BONES AND SOFT TISSUES: Normal marrow signal. No acute soft tissue abnormality. IMPRESSION: 1. No acute intracranial abnormality. Electronically signed by: Gilmore Molt MD 04/24/2024 08:00 PM EDT RP Workstation: [id number]   CT ANGIO HEAD NECK W WO CM Result Date: 04/24/2024 EXAM: CT HEAD WITHOUT CTA HEAD AND NECK WITH AND WITHOUT 04/24/2024 05:58:37 PM TECHNIQUE: CTA of the head and neck was performed with and without the administration of intravenous contrast. Noncontrast CT of the head with reconstructed 2-D images are also provided for review. Multiplanar 2D and/or 3D reformatted images are provided for review.  Automated exposure control, iterative reconstruction, and/or weight based adjustment of the mA/kV was utilized to reduce the radiation dose to as low as reasonably achievable. COMPARISON: CT head without contrast 08/25/2007. CLINICAL HISTORY: FINDINGS: CT HEAD: BRAIN AND VENTRICLES: No acute intracranial hemorrhage. No mass effect or midline shift. No extra-axial fluid  collection. Gray-white differentiation is maintained. No hydrocephalus. ORBITS: No acute abnormality. SINUSES: No acute abnormality. SOFT TISSUES AND SKULL: No acute abnormality. CTA NECK: AORTIC ARCH AND ARCH VESSELS: Common origin of the left common carotid artery and the innominate artery is noted. Atherosclerotic changes are present in the distal aortic arch. No aneurysm or stenosis is present. No dissection or arterial injury. No significant stenosis of the brachiocephalic or subclavian arteries. CERVICAL CAROTID ARTERIES: Atherosclerotic changes are at the left carotid bifurcation proximal ICA. The minimal luminal diameter is 2.1 mm. As compared to distal left ICA lumen of 3.7 mm. This results in 45% stenosis of the left internal carotid artery at the bifurcation by NASCET criteria. No dissection, arterial injury, or hemodynamically significant stenosis by NASCET criteria. CERVICAL VERTEBRAL ARTERIES: Vertebral artery is slightly dominant. No dissection, arterial injury, or significant stenosis. VISUALIZED LUNGS AND MEDIASTINUM: Unremarkable. SOFT TISSUES: No acute abnormality. BONES: No acute abnormality. CTA HEAD: ANTERIOR CIRCULATION: No significant stenosis of the internal carotid arteries. Mild narrowing is present in the proximal left A1 segment. Moderate stenosis is present in the distal right M1 segment. No aneurysm. POSTERIOR CIRCULATION: Mild narrowing is present in the left V4 segment. High-grade stenosis and occlusion is present in the distal left posterior cerebral artery P3 segment. No significant stenosis of the basilar artery. No  aneurysm. OTHER: No dural venous sinus thrombosis on this non-dedicated study. IMPRESSION: 1. No acute intracranial abnormality. 2. 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 3. 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 4. 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 Electronically signed by: Lonni Necessary MD 04/24/2024 06:27 PM EDT RP Workstation: [id number]   DG Chest 2 View Result Date: 04/24/2024 CLINICAL DATA:  Shortness of breath. Near syncope. Chest tightness. EXAM: CHEST - 2 VIEW COMPARISON:  01/02/2022 FINDINGS: The heart is mildly enlarged. Mediastinal contours are stable. No pulmonary edema. No focal airspace disease, pleural effusion, or pneumothorax. No acute osseous findings. IMPRESSION: Mild cardiomegaly. Electronically Signed   By: Andrea Gasman M.D.   On: 04/24/2024 18:26         "

## 2024-07-22 ENCOUNTER — Encounter (HOSPITAL_COMMUNITY): Payer: Self-pay

## 2024-07-22 NOTE — Telephone Encounter (Signed)
 Testing results available in Media.

## 2025-01-10 ENCOUNTER — Ambulatory Visit: Admitting: General Practice

## 2025-01-13 ENCOUNTER — Inpatient Hospital Stay

## 2025-01-13 ENCOUNTER — Inpatient Hospital Stay: Admitting: Oncology

## 2025-06-05 ENCOUNTER — Ambulatory Visit
